# Patient Record
Sex: Female | Born: 1965 | Hispanic: Yes | Marital: Married | Smoking: Never smoker
Health system: Southern US, Community
[De-identification: ages and names within clinical notes are randomized; demographics above are authoritative.]

## PROBLEM LIST (undated history)

## (undated) DIAGNOSIS — M5432 Sciatica, left side: Secondary | ICD-10-CM

## (undated) DIAGNOSIS — R202 Paresthesia of skin: Secondary | ICD-10-CM

## (undated) DIAGNOSIS — E785 Hyperlipidemia, unspecified: Secondary | ICD-10-CM

## (undated) DIAGNOSIS — F419 Anxiety disorder, unspecified: Secondary | ICD-10-CM

## (undated) DIAGNOSIS — K635 Polyp of colon: Secondary | ICD-10-CM

## (undated) DIAGNOSIS — D509 Iron deficiency anemia, unspecified: Secondary | ICD-10-CM

## (undated) DIAGNOSIS — F418 Other specified anxiety disorders: Secondary | ICD-10-CM

## (undated) DIAGNOSIS — G5603 Carpal tunnel syndrome, bilateral upper limbs: Secondary | ICD-10-CM

## (undated) DIAGNOSIS — E669 Obesity, unspecified: Secondary | ICD-10-CM

## (undated) DIAGNOSIS — R7303 Prediabetes: Secondary | ICD-10-CM

## (undated) DIAGNOSIS — Z862 Personal history of diseases of the blood and blood-forming organs and certain disorders involving the immune mechanism: Secondary | ICD-10-CM

## (undated) DIAGNOSIS — I10 Essential (primary) hypertension: Secondary | ICD-10-CM

## (undated) HISTORY — DX: Other specified anxiety disorders: F41.8

## (undated) HISTORY — DX: Prediabetes: R73.03

## (undated) HISTORY — DX: Paresthesia of skin: R20.2

## (undated) HISTORY — DX: Sciatica, left side: M54.32

## (undated) HISTORY — DX: Carpal tunnel syndrome, bilateral upper limbs: G56.03

## (undated) HISTORY — DX: Obesity, unspecified: E66.9

## (undated) HISTORY — DX: Anxiety disorder, unspecified: F41.9

## (undated) HISTORY — DX: Personal history of diseases of the blood and blood-forming organs and certain disorders involving the immune mechanism: Z86.2

## (undated) HISTORY — DX: Polyp of colon: K63.5

## (undated) HISTORY — DX: Iron deficiency anemia, unspecified: D50.9

## (undated) HISTORY — DX: Hyperlipidemia, unspecified: E78.5

---

## 2004-03-10 HISTORY — PX: ENDOMETRIAL ABLATION: SHX621

## 2004-06-22 HISTORY — PX: COLONOSCOPY: SHX174

## 2006-06-18 ENCOUNTER — Ambulatory Visit (HOSPITAL_COMMUNITY): Admission: RE | Admit: 2006-06-18 | Discharge: 2006-06-18 | Payer: Self-pay | Admitting: Family Medicine

## 2007-07-25 ENCOUNTER — Emergency Department (HOSPITAL_COMMUNITY): Admission: EM | Admit: 2007-07-25 | Discharge: 2007-07-25 | Payer: Self-pay | Admitting: Emergency Medicine

## 2007-09-29 ENCOUNTER — Ambulatory Visit (HOSPITAL_COMMUNITY): Admission: RE | Admit: 2007-09-29 | Discharge: 2007-09-29 | Payer: Self-pay | Admitting: Family Medicine

## 2009-01-23 ENCOUNTER — Ambulatory Visit (HOSPITAL_COMMUNITY): Admission: RE | Admit: 2009-01-23 | Discharge: 2009-01-23 | Payer: Self-pay | Admitting: Family Medicine

## 2010-01-28 ENCOUNTER — Ambulatory Visit (HOSPITAL_COMMUNITY): Admission: RE | Admit: 2010-01-28 | Discharge: 2010-01-28 | Payer: Self-pay | Admitting: Family Medicine

## 2011-01-21 ENCOUNTER — Other Ambulatory Visit (HOSPITAL_COMMUNITY): Payer: Self-pay | Admitting: Family Medicine

## 2011-01-21 DIAGNOSIS — Z139 Encounter for screening, unspecified: Secondary | ICD-10-CM

## 2011-02-03 ENCOUNTER — Ambulatory Visit (HOSPITAL_COMMUNITY)
Admission: RE | Admit: 2011-02-03 | Discharge: 2011-02-03 | Disposition: A | Discharge status: Home / Self Care | Payer: Self-pay | Source: Ambulatory Visit | Attending: Family Medicine | Admitting: Family Medicine

## 2011-02-03 DIAGNOSIS — Z139 Encounter for screening, unspecified: Secondary | ICD-10-CM

## 2011-11-25 IMAGING — MG MM DIGITAL [PERSON_NAME] SCREEN BILAT
5 series · 5 of 5 positions shown · non-contrast
Comparison: Prior studies.

DG DIOSES SCREENING BILAT
Bilateral CC and MLO view(s) were taken.

DIGITAL SCREENING MAMMOGRAM WITH CAD:

[R CC]
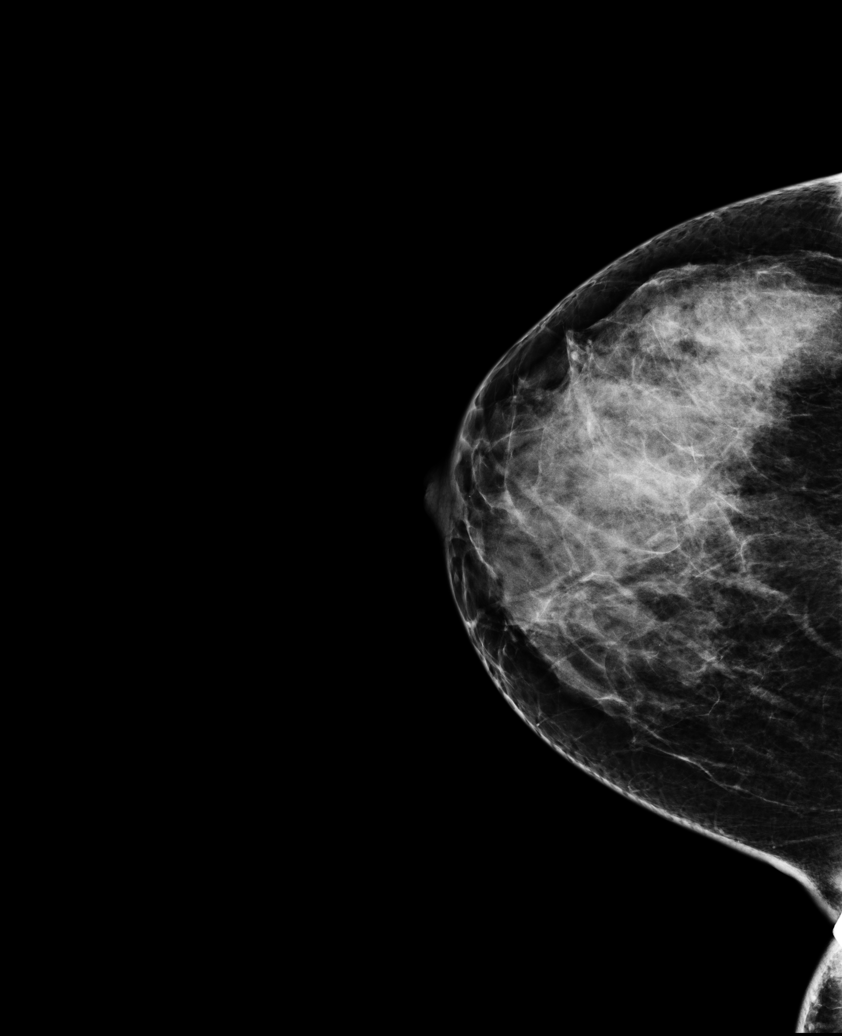

[R MLO (1 of 2)]
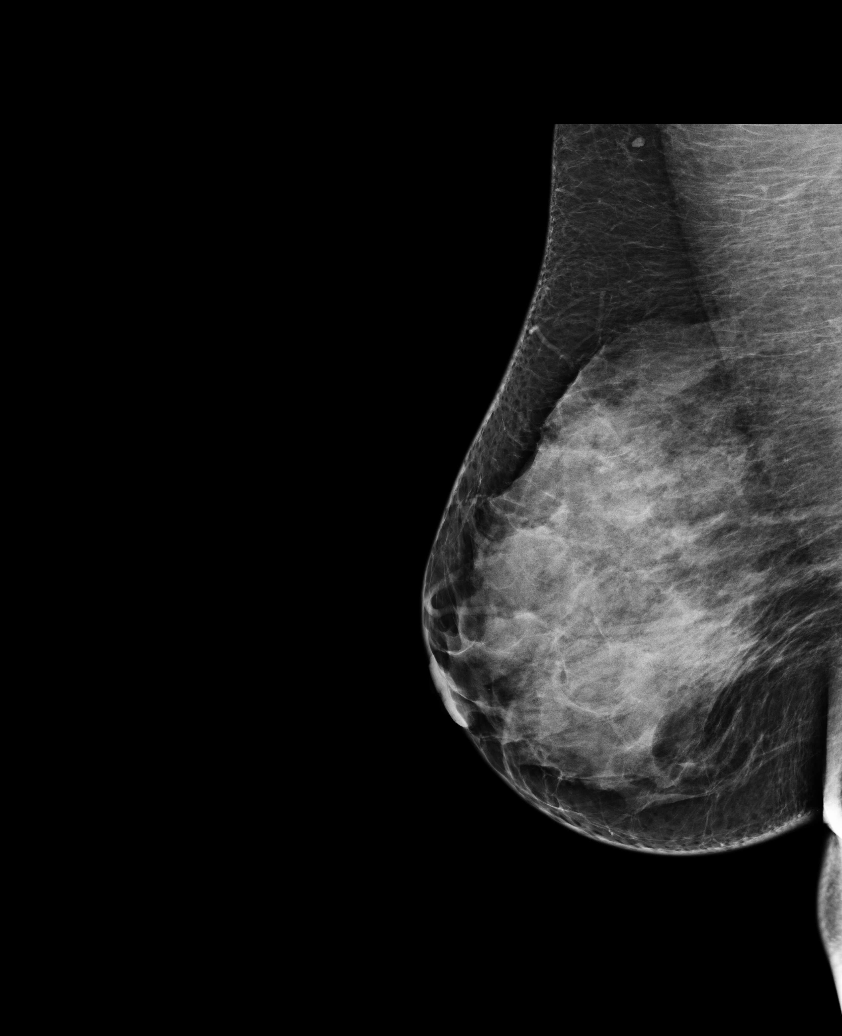

[L CC]
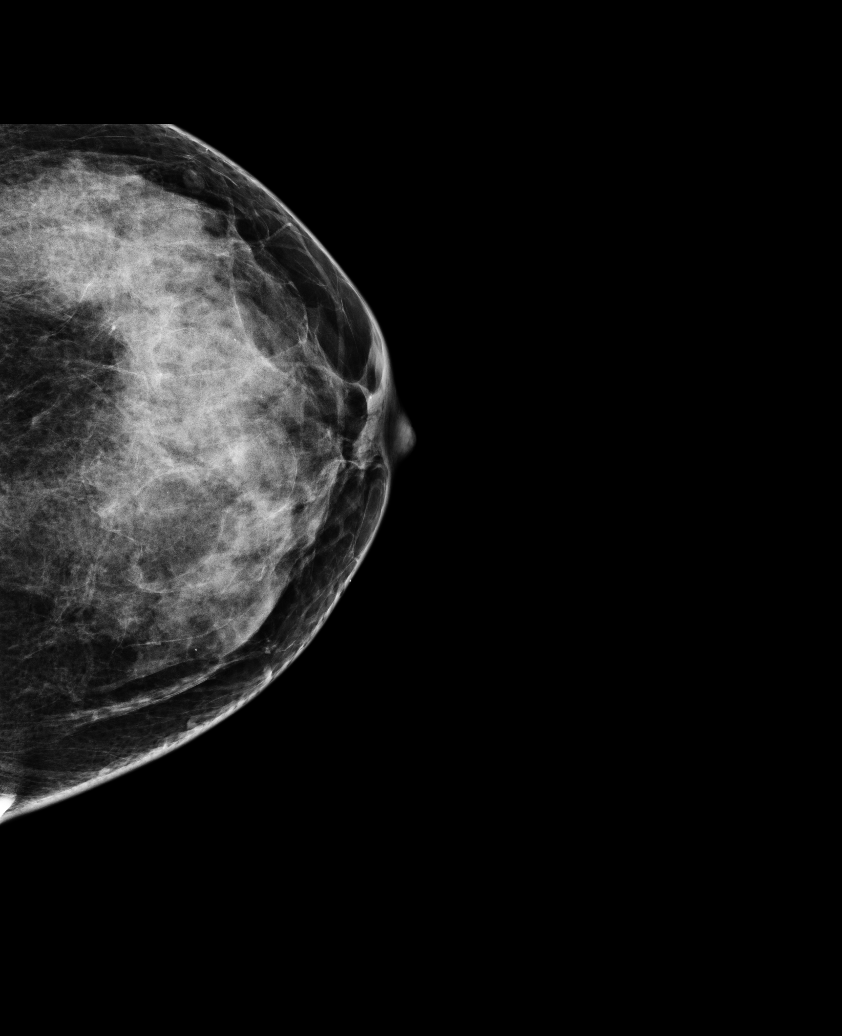

[L MLO]
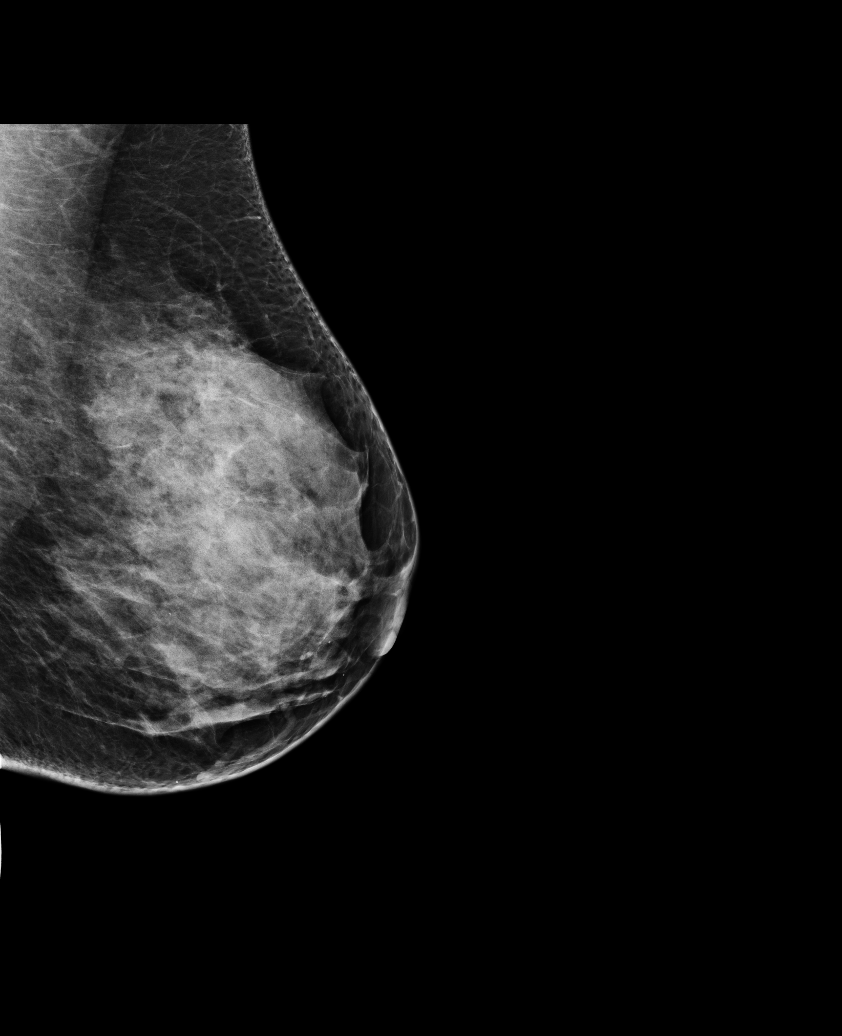

[R MLO (2 of 2)]
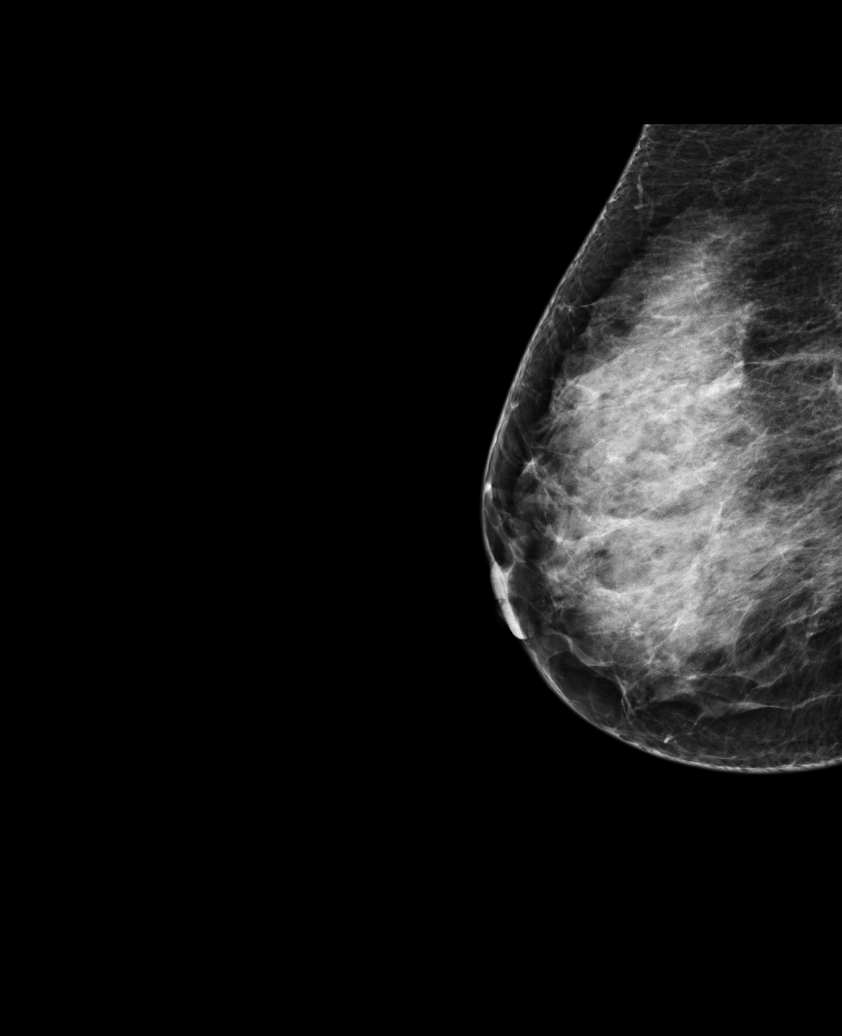

[5 of 5 positions shown; findings below may reference images not displayed]

The breast tissue is extremely dense.  There is no dominant mass, architectural distortion or 
calcification to suggest malignancy.

Images were processed with CAD.
IMPRESSION: No mammographic evidence of malignancy.  Suggest yearly screening mammography.

A result letter of this screening mammogram will be mailed directly to the patient.

ASSESSMENT: Negative - BI-RADS 1

Screening mammogram in 1 year.
,

## 2012-09-06 ENCOUNTER — Other Ambulatory Visit (HOSPITAL_COMMUNITY): Payer: Self-pay | Admitting: Nurse Practitioner

## 2012-09-06 DIAGNOSIS — Z139 Encounter for screening, unspecified: Secondary | ICD-10-CM

## 2012-09-09 ENCOUNTER — Ambulatory Visit (HOSPITAL_COMMUNITY)
Admission: RE | Admit: 2012-09-09 | Discharge: 2012-09-09 | Disposition: A | Discharge status: Home / Self Care | Payer: Self-pay | Source: Ambulatory Visit | Attending: Nurse Practitioner | Admitting: Nurse Practitioner

## 2012-09-09 DIAGNOSIS — Z139 Encounter for screening, unspecified: Secondary | ICD-10-CM

## 2014-07-07 IMAGING — MG MM DIGITAL SCREENING BILAT
4 series · 4 of 4 positions shown · non-contrast
Comparison: Previous exam(s).

CLINICAL DATA: Screening.

DIGITAL SCREENING BILATERAL MAMMOGRAM WITH CAD

[L CC]
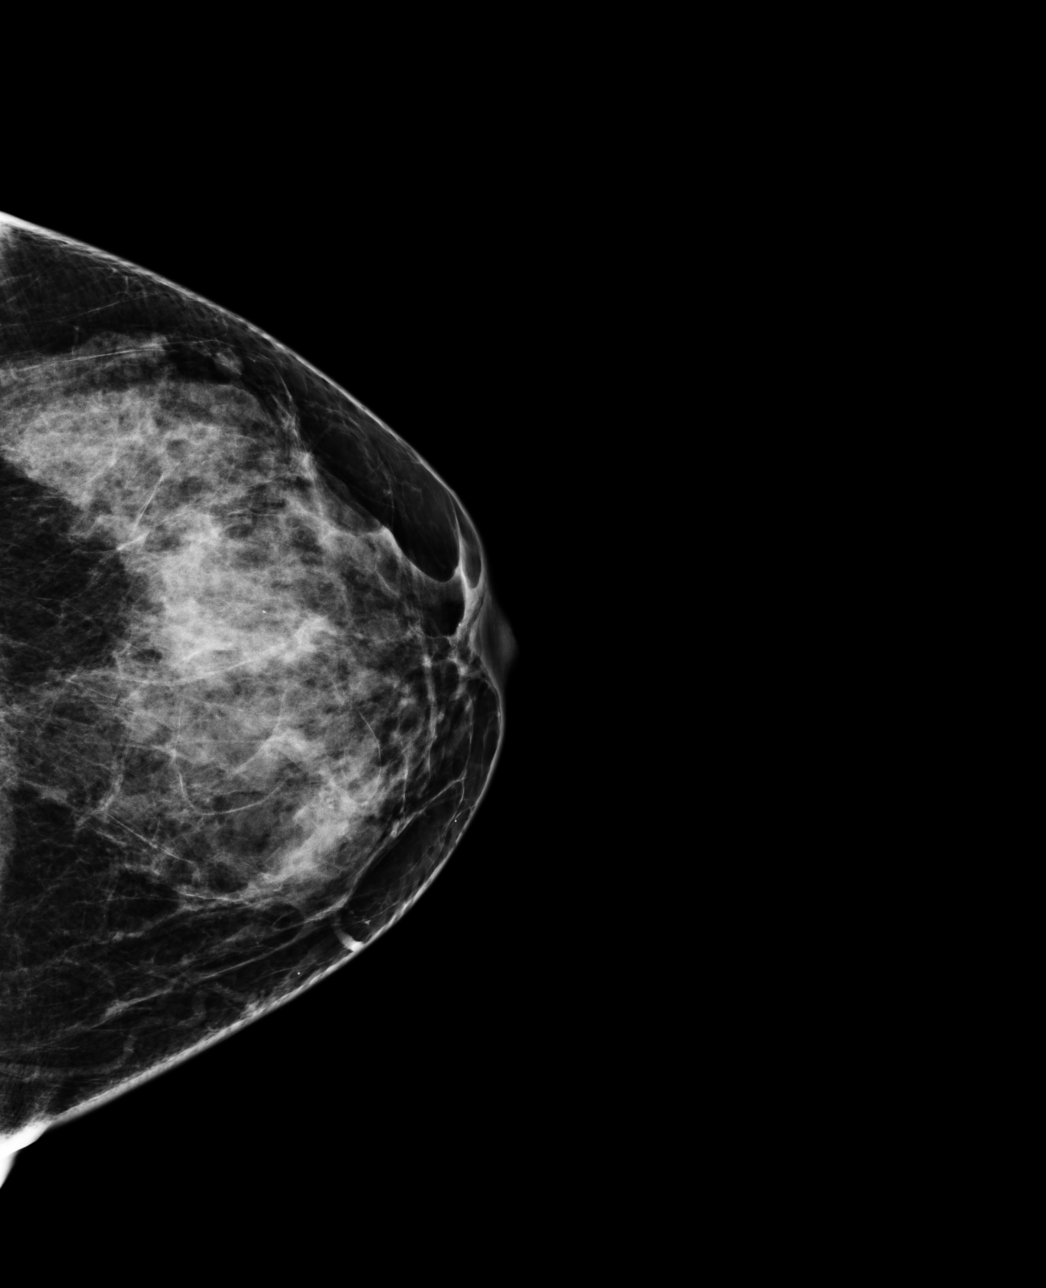

[L MLO]
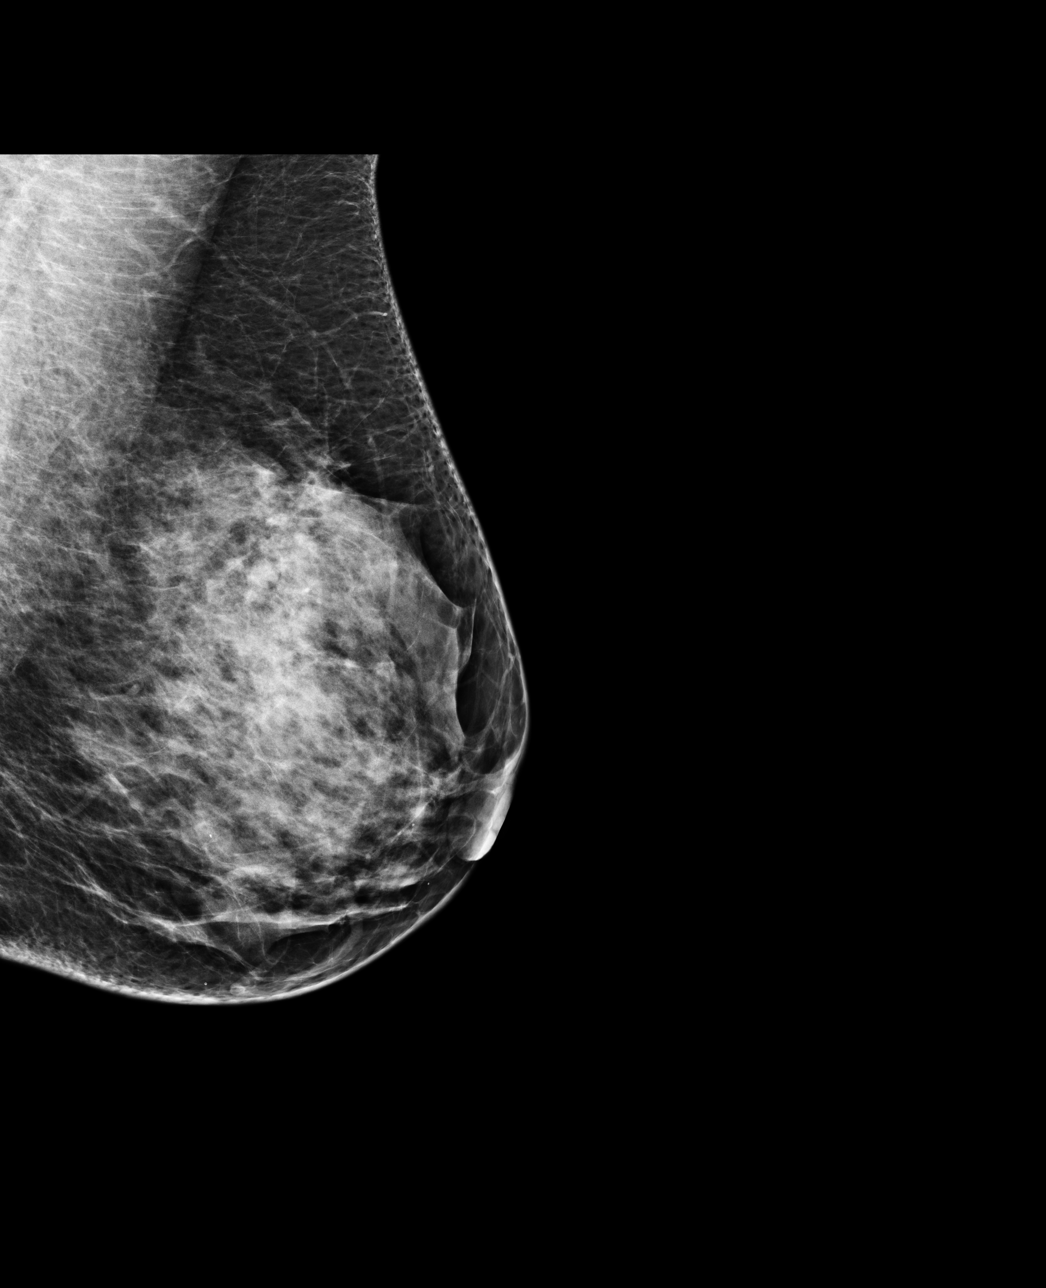

[R CC]
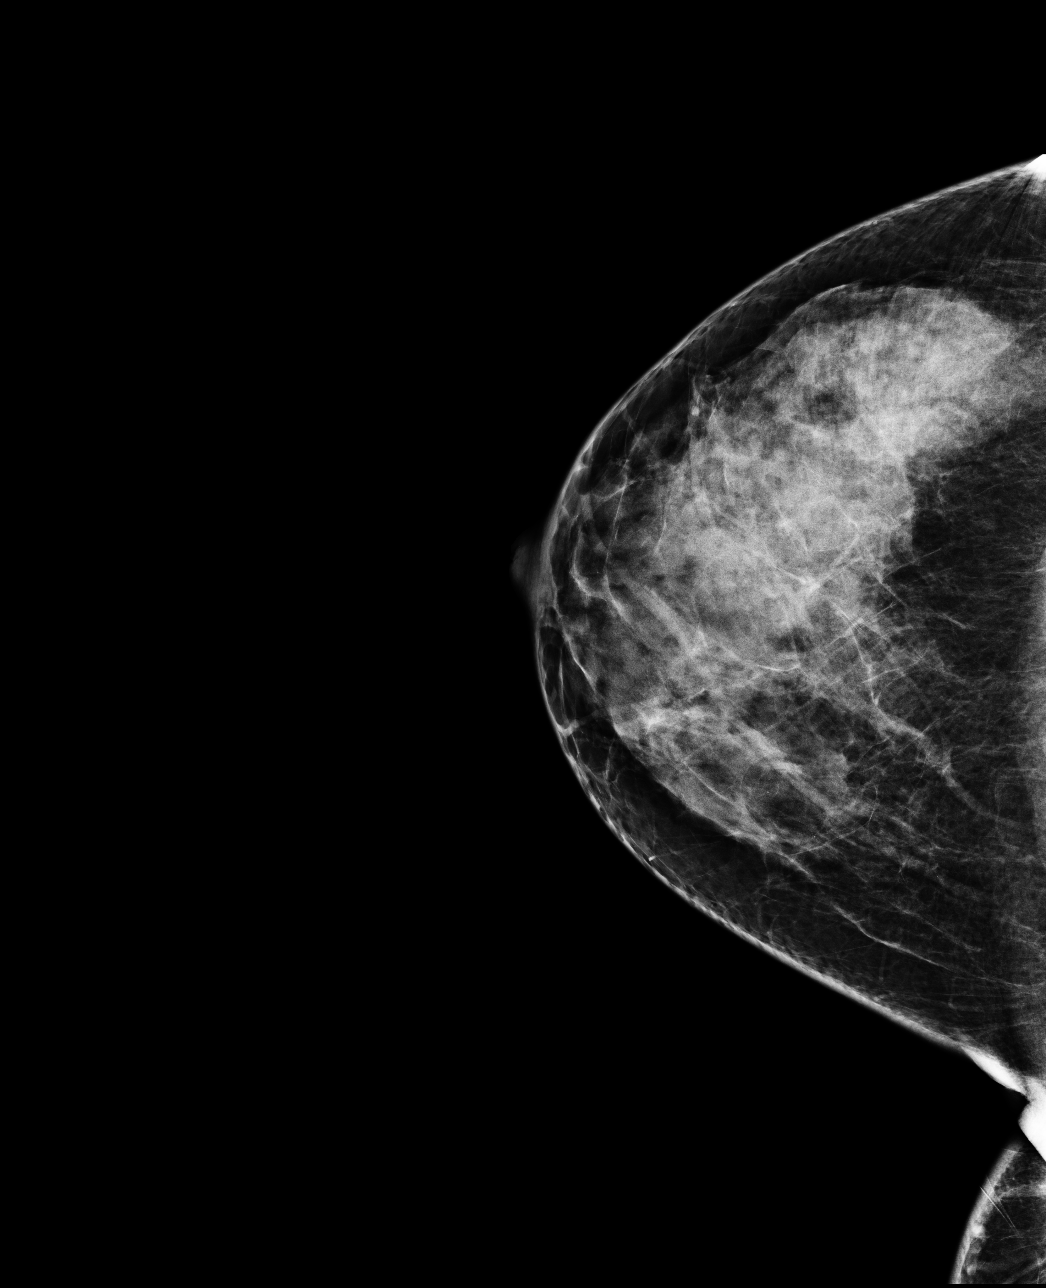

[R MLO]
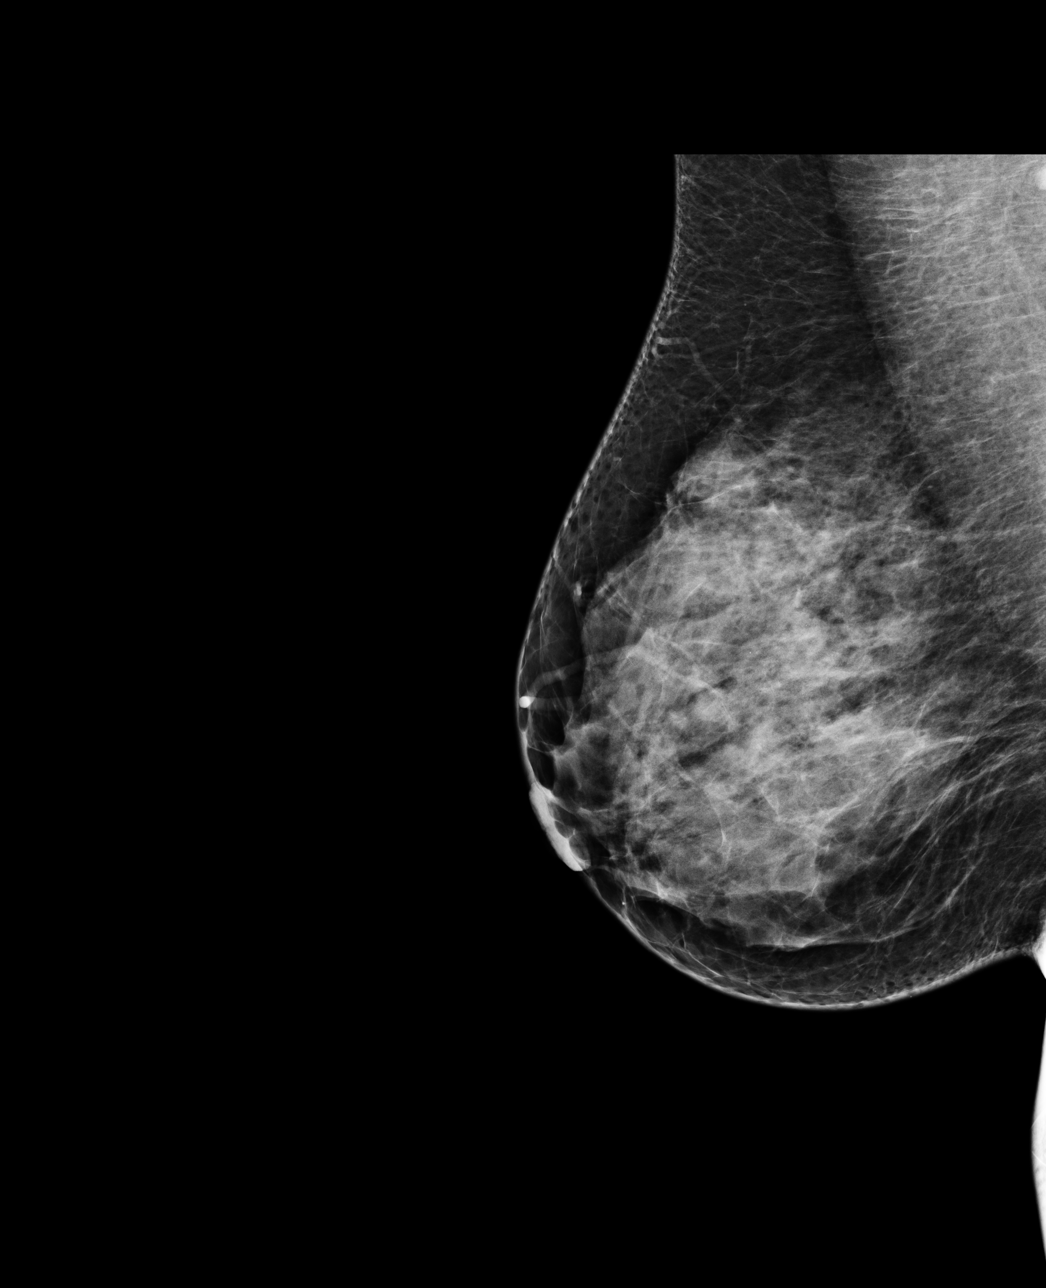

[4 of 4 positions shown; findings below may reference images not displayed]

FINDINGS: ACR Breast Density Category c:  The breasts are heterogeneously
dense, which may obscure small masses.

There are no findings suspicious for malignancy.

Images were processed with CAD.
IMPRESSION: No mammographic evidence of malignancy.

A result letter of this screening mammogram will be mailed directly
to the patient.

RECOMMENDATION:
Screening mammogram in one year. (Code:KZ-2-2F5)

BI-RADS CATEGORY 1:  Negative.

## 2015-06-13 ENCOUNTER — Emergency Department (HOSPITAL_COMMUNITY): Payer: BLUE CROSS/BLUE SHIELD

## 2015-06-13 ENCOUNTER — Emergency Department (HOSPITAL_COMMUNITY)
Admission: EM | Admit: 2015-06-13 | Discharge: 2015-06-13 | Disposition: A | Discharge status: Home / Self Care | Payer: BLUE CROSS/BLUE SHIELD | Attending: Emergency Medicine | Admitting: Emergency Medicine

## 2015-06-13 ENCOUNTER — Encounter (HOSPITAL_COMMUNITY): Payer: Self-pay | Admitting: Emergency Medicine

## 2015-06-13 DIAGNOSIS — Z79899 Other long term (current) drug therapy: Secondary | ICD-10-CM | POA: Diagnosis not present

## 2015-06-13 DIAGNOSIS — R0789 Other chest pain: Secondary | ICD-10-CM | POA: Diagnosis not present

## 2015-06-13 DIAGNOSIS — I10 Essential (primary) hypertension: Secondary | ICD-10-CM | POA: Diagnosis not present

## 2015-06-13 HISTORY — DX: Essential (primary) hypertension: I10

## 2015-06-13 LAB — COMPREHENSIVE METABOLIC PANEL
ALT: 35 U/L (ref 14–54)
AST: 21 U/L (ref 15–41)
Albumin: 4.4 g/dL (ref 3.5–5.0)
Alkaline Phosphatase: 99 U/L (ref 38–126)
Anion gap: 8 (ref 5–15)
BUN: 8 mg/dL (ref 6–20)
CHLORIDE: 104 mmol/L (ref 101–111)
CO2: 25 mmol/L (ref 22–32)
CREATININE: 0.63 mg/dL (ref 0.44–1.00)
Calcium: 9.5 mg/dL (ref 8.9–10.3)
Glucose, Bld: 108 mg/dL — ABNORMAL HIGH (ref 65–99)
POTASSIUM: 4 mmol/L (ref 3.5–5.1)
Sodium: 137 mmol/L (ref 135–145)
TOTAL PROTEIN: 7.8 g/dL (ref 6.5–8.1)
Total Bilirubin: 0.9 mg/dL (ref 0.3–1.2)

## 2015-06-13 LAB — TROPONIN I

## 2015-06-13 LAB — CBC
HCT: 43.7 % (ref 36.0–46.0)
Hemoglobin: 14.8 g/dL (ref 12.0–15.0)
MCH: 31.2 pg (ref 26.0–34.0)
MCHC: 33.9 g/dL (ref 30.0–36.0)
MCV: 92 fL (ref 78.0–100.0)
PLATELETS: 310 10*3/uL (ref 150–400)
RBC: 4.75 MIL/uL (ref 3.87–5.11)
RDW: 13.2 % (ref 11.5–15.5)
WBC: 11.4 10*3/uL — ABNORMAL HIGH (ref 4.0–10.5)

## 2015-06-13 NOTE — ED Notes (Signed)
Onset today woke up with chest burning radiating in jaw and back, with sob

## 2015-06-13 NOTE — ED Provider Notes (Signed)
CSN: [id number]     Arrival date & time 06/13/15  1556 History   First MD Initiated Contact with Patient 06/13/15 1746     Chief Complaint  Patient presents with  . Chest Pain     (Consider location/radiation/quality/duration/timing/severity/associated sxs/prior Treatment) HPI  49 year old female who presents with chest pain. History of hypertension but currently not requiring any medications for this. States that she woke up this morning with sharp burning pain over the left side of her chest to the left shoulder and left side of her neck. Pain has been constant since 2 AM, and seems to be worse when she moves. No shortness of breath, lightheadedness or syncope. States that she has been living heavy boxes of bottles recently at work prior to this. No lower extremity edema or pain. No history of exertional chest pain or dyspnea. Past Medical History  Diagnosis Date  . Hypertension    History reviewed. No pertinent past surgical history. History reviewed. No pertinent family history. Social History  Substance Use Topics  . Smoking status: Never Smoker   . Smokeless tobacco: None  . Alcohol Use: No   OB History    No data available     Review of Systems 10/14 systems reviewed and are negative other than those stated in the HPI    Allergies  Review of patient's allergies indicates no known allergies.  Home Medications   Prior to Admission medications   Medication Sig Start Date End Date Taking? Authorizing Provider  acetaminophen (TYLENOL) 500 MG tablet Take 500 mg by mouth every 8 (eight) hours as needed for mild pain.   Yes Historical Provider, MD  fexofenadine (ALLEGRA ALLERGY) 180 MG tablet Take 180 mg by mouth daily.   Yes Historical Provider, MD  Omega 3 1000 MG CAPS Take 1,000 mg by mouth daily.   Yes Historical Provider, MD   BP 141/81 mmHg  Pulse 68  Temp(Src) 97.9 F (36.6 C) (Oral)  Resp 17  Ht 5' (1.524 m)  Wt 169 lb (76.658 kg)  BMI 33.01 kg/m2  SpO2 98%   LMP 05/13/2015 Physical Exam Physical Exam  Nursing note and vitals reviewed. Constitutional: Well developed, well nourished, non-toxic, and in no acute distress Head: Normocephalic and atraumatic.  Mouth/Throat: Oropharynx is clear and moist.  Neck: Normal range of motion. Neck supple.  Cardiovascular: Normal rate and regular rhythm.   Pulmonary/Chest: Effort normal and breath sounds normal. Pain with movement of torso and rom of the LUE Abdominal: Soft. There is no tenderness. There is no rebound and no guarding.  Musculoskeletal: Normal range of motion.  Neurological: Alert, no facial droop, fluent speech, moves all extremities symmetrically Skin: Skin is warm and dry.  Psychiatric: Cooperative  ED Course  Procedures (including critical care time) Labs Review Labs Reviewed  CBC - Abnormal; Notable for the following:    WBC 11.4 (*)    All other components within normal limits  COMPREHENSIVE METABOLIC PANEL - Abnormal; Notable for the following:    Glucose, Bld 108 (*)    All other components within normal limits  TROPONIN I  TROPONIN I    Imaging Review Dg Chest 2 View  06/13/2015  CLINICAL DATA:  Central chest pain radiating to right upper back and right side of neck beginning this morning EXAM: CHEST  2 VIEW COMPARISON:  07/25/2007 FINDINGS: The heart size and mediastinal contours are within normal limits. Both lungs are clear. The visualized skeletal structures are unremarkable. IMPRESSION: No active cardiopulmonary disease. Electronically  Signed   By: Charlett NoseKevin  Dover M.D.   On: 06/13/2015 16:46   I have personally reviewed and evaluated these images and lab results as part of my medical decision-making.   EKG Interpretation None       EKG ID [id number] 06/13/2015 16:15:13 Normal sinus rhythm, normal axis, normal intervals, no acute ST or T-wave changes to suggest acute ischemia or infarction.  MDM   Final diagnoses:  Chest wall pain  Atypical chest pain     50 year old female who presents with chest pain. She is nontoxic in no acute distress with stable vital signs are presentation. Chest pain seems atypical in nature, and is fully reproducible with range of motion and twisting of her torso. Suggestive of musculoskeletal etiology especially in the setting of heavy lifting of boxes and bottles recently. Her EKG is nonischemic, her chest x-ray shows no acute cardiopulmonary processes, and serial troponins are negative. A repeat EKG also does not show any dynamic changes. Presentation not concerning for that a PE or dissection or other serious or toxic etiology of her pain. Discussed supportive care management for home. Strict return and follow-up instructions are also reviewed. She expressed understanding of all discharge instructions, and felt comfortable to plan of care.     Lavera Guiseana Duo , MD 06/14/15 [phone]

## 2015-06-13 NOTE — ED Notes (Signed)
Pt states understanding of care given and follow up instructions.  Ambulated from ED  

## 2015-06-13 NOTE — Discharge Instructions (Signed)
You do not look like you have any serious cause of your chest pain today. It is likely pain involving the muscles of your chest. Take Tylenol and Motrin as needed for pain control. Return without fail for worsening symptoms including passing out, difficulty breathing, pain with exertion activities, or any other symptoms concerning to you.  Chest Wall Pain Chest wall pain is pain in or around the bones and muscles of your chest. Sometimes, an injury causes this pain. Sometimes, the cause may not be known. This pain may take several weeks or longer to get better. HOME CARE INSTRUCTIONS  Pay attention to any changes in your symptoms. Take these actions to help with your pain:   Rest as told by your health care provider.   Avoid activities that cause pain. These include any activities that use your chest muscles or your abdominal and side muscles to lift heavy items.   If directed, apply ice to the painful area:  Put ice in a plastic bag.  Place a towel between your skin and the bag.  Leave the ice on for 20 minutes, 2-3 times per day.  Take over-the-counter and prescription medicines only as told by your health care provider.  Do not use tobacco products, including cigarettes, chewing tobacco, and e-cigarettes. If you need help quitting, ask your health care provider.  Keep all follow-up visits as told by your health care provider. This is important. SEEK MEDICAL CARE IF:  You have a fever.  Your chest pain becomes worse.  You have new symptoms. SEEK IMMEDIATE MEDICAL CARE IF:  You have nausea or vomiting.  You feel sweaty or light-headed.  You have a cough with phlegm (sputum) or you cough up blood.  You develop shortness of breath.   This information is not intended to replace advice given to you by your health care provider. Make sure you discuss any questions you have with your health care provider.   Document Released: 02/24/2005 Document Revised: 11/15/2014 Document  Reviewed: 05/22/2014 Elsevier Interactive Patient Education 2016 Elsevier Inc.  Nonspecific Chest Pain It is often hard to find the cause of chest pain. There is always a chance that your pain could be related to something serious, such as a heart attack or a blood clot in your lungs. Chest pain can also be caused by conditions that are not life-threatening. If you have chest pain, it is very important to follow up with your doctor.  HOME CARE  If you were prescribed an antibiotic medicine, finish it all even if you start to feel better.  Avoid any activities that cause chest pain.  Do not use any tobacco products, including cigarettes, chewing tobacco, or electronic cigarettes. If you need help quitting, ask your doctor.  Do not drink alcohol.  Take medicines only as told by your doctor.  Keep all follow-up visits as told by your doctor. This is important. This includes any further testing if your chest pain does not go away.  Your doctor may tell you to keep your head raised (elevated) while you sleep.  Make lifestyle changes as told by your doctor. These may include:  Getting regular exercise. Ask your doctor to suggest some activities that are safe for you.  Eating a heart-healthy diet. Your doctor or a diet specialist (dietitian) can help you to learn healthy eating options.  Maintaining a healthy weight.  Managing diabetes, if necessary.  Reducing stress. GET HELP IF:  Your chest pain does not go away, even after treatment.  You have a rash with blisters on your chest.  You have a fever. GET HELP RIGHT AWAY IF:  Your chest pain is worse.  You have an increasing cough, or you cough up blood.  You have severe belly (abdominal) pain.  You feel extremely weak.  You pass out (faint).  You have chills.  You have sudden, unexplained chest discomfort.  You have sudden, unexplained discomfort in your arms, back, neck, or jaw.  You have shortness of breath at  any time.  You suddenly start to sweat, or your skin gets clammy.  You feel nauseous.  You vomit.  You suddenly feel light-headed or dizzy.  Your heart begins to beat quickly, or it feels like it is skipping beats. These symptoms may be an emergency. Do not wait to see if the symptoms will go away. Get medical help right away. Call your local emergency services (911 in the [place] Do not drive yourself to the hospital.   This information is not intended to replace advice given to you by your health care provider. Make sure you discuss any questions you have with your health care provider.   Document Released: 08/13/2007 Document Revised: 03/17/2014 Document Reviewed: 09/30/2013 Elsevier Interactive Patient Education [employer]2016 Elsevier Inc.

## 2015-12-21 ENCOUNTER — Ambulatory Visit (HOSPITAL_COMMUNITY)
Admission: RE | Admit: 2015-12-21 | Discharge: 2015-12-21 | Disposition: A | Discharge status: Home / Self Care | Payer: BLUE CROSS/BLUE SHIELD | Source: Ambulatory Visit | Attending: Physician Assistant | Admitting: Physician Assistant

## 2015-12-21 ENCOUNTER — Other Ambulatory Visit (HOSPITAL_COMMUNITY): Payer: Self-pay | Admitting: Physician Assistant

## 2015-12-21 DIAGNOSIS — Z1231 Encounter for screening mammogram for malignant neoplasm of breast: Secondary | ICD-10-CM

## 2015-12-27 ENCOUNTER — Telehealth: Payer: Self-pay

## 2015-12-27 NOTE — Telephone Encounter (Signed)
Pt is calling to make appointment but I told her that I did not see the referral and she said that it was from June. I told her that I would call the doctor to sent a new referral and we would call her back. I talked with her PCP and they are going to look into it and call us back.

## 2015-12-31 ENCOUNTER — Telehealth: Payer: Self-pay

## 2015-12-31 NOTE — Telephone Encounter (Signed)
Pt had called Thursday last week about referral. DS mailed triage letter out today. Pt is wanting to schedule. Please call [phone]

## 2016-01-01 NOTE — Telephone Encounter (Signed)
I spoke to pt. She had her last colon[place] in 2006 in [place]. She does have family hx of colon cancer. She is also having some problems with hemorrhoids and rectal bleeding. She has been scheduled an OV with Wynne DustEric Gill, NP on 01/15/2016 at 10:30 Am.

## 2016-01-15 ENCOUNTER — Ambulatory Visit (INDEPENDENT_AMBULATORY_CARE_PROVIDER_SITE_OTHER): Payer: BLUE CROSS/BLUE SHIELD | Admitting: Nurse Practitioner

## 2016-01-15 ENCOUNTER — Other Ambulatory Visit: Payer: Self-pay

## 2016-01-15 ENCOUNTER — Encounter: Payer: Self-pay | Admitting: Nurse Practitioner

## 2016-01-15 ENCOUNTER — Telehealth: Payer: Self-pay | Admitting: Internal Medicine

## 2016-01-15 DIAGNOSIS — K922 Gastrointestinal hemorrhage, unspecified: Secondary | ICD-10-CM | POA: Insufficient documentation

## 2016-01-15 DIAGNOSIS — K625 Hemorrhage of anus and rectum: Secondary | ICD-10-CM

## 2016-01-15 DIAGNOSIS — K649 Unspecified hemorrhoids: Secondary | ICD-10-CM

## 2016-01-15 NOTE — Assessment & Plan Note (Signed)
The patient thinks she has hemorrhoids. No overt rectal pain or discomfort. Is having scant toilet tissue hematochezia/rectal bleeding. I will send an Anusol rectal cream to help with her symptoms. Continue to monitor, colonoscopy as noted above. Return for follow-up as needed. May be a candidate for hemorrhoid banding depending on hemorrhoid size and location.

## 2016-01-15 NOTE — Progress Notes (Signed)
cc'ed to pcp °

## 2016-01-15 NOTE — Telephone Encounter (Signed)
Patient was seen today and called this afternoon [facility] to them [facility]. I told her that I would send a message to the nurse and for her to call the pharmacy later this afternoon to check first to see if it was there.

## 2016-01-15 NOTE — Assessment & Plan Note (Signed)
The patient notes rectal bleeding which she thinks are coming from hemorrhoids. However, her last colonoscopy was over 10 years ago and she is 50 years old currently. Last colonoscopy noted what appears to be internal hemorrhoids on retroflexed view. The images were marked for scanning, no report accompanied images. Hemorrhoid management as noted above. At this point we will proceed with diagnostic colonoscopy for rectal bleeding and cancer screening. Return for follow-up based on postprocedure recommendations.  The patient may be a candidate for hemorrhoid banding  Proceed with TCS with 25 mg preprocedure Phenergan with Dr. Jena Gaussourk in near future: the risks, benefits, and alternatives have been discussed with the patient in detail. The patient states understanding and desires to proceed.  **NOTE: Patient is Jehovah's Witness and DOES NOT want any blood products** The patient is currently on Wellbutrin and Prozac. Denies alcohol and drug use. No other anticoagulants, anxiolytics, chronic pain medications, or antidepressants. I will provide 25 mg preprocedure Phenergan to promote adequate sedation.

## 2016-01-15 NOTE — Telephone Encounter (Signed)
Routing to EG 

## 2016-01-15 NOTE — Progress Notes (Signed)
Primary Care Physician:  Selinda FlavinHOWARD, KEVIN, MD Primary Gastroenterologist:  Dr. Jena Gaussourk  Chief Complaint  Patient presents with  . Colonoscopy    had one in 2006  . Hemorrhoids    has bleeding    HPI:   Gloria Nelson is a 50 y.o. female who presents To schedule diagnostic colonoscopy. PCP notes reviewed. The [facility] [place]. PCP notes reviewed which indicates 50 year old female with no family history of colon cancer due for screening colonoscopy. Her last colonoscopy was over 10 years ago. Images provided from previous colonoscopy which shows what appear to be internal hemorrhoids. Patient also complained of hemorrhoid symptoms and rectal bleeding. Phone triage was deferred to an office visit due to family history of colon cancer, hemorrhoid symptoms, rectal bleeding.  Today she is accompanied by a Nurse, learning disabilitytranslator. Today she states she's doing well overall. Last one [place] >10 years ago in [place]. Thinks she's also having hemorrhoid symptoms. Denies hematochezia all last week and yesterday, none today as of yet. Denies rectal pain or irritation. Denies abdominal pain, N/V, melena, fever, chills, acute changes in bowel habits, unintentional weight loss.   **NOTE: Patient is Jehovah's Witness and DOES NOT want any blood products**  Past Medical History:  Diagnosis Date  . Hypertension     Past Surgical History:  Procedure Laterality Date  . COLONOSCOPY  06/22/2004   Images only (marked for scanning), no report.  . ENDOMETRIAL ABLATION  2006    Current Outpatient Prescriptions  Medication Sig Dispense Refill  . acetaminophen (TYLENOL) 500 MG tablet Take 500 mg by mouth every 8 (eight) hours as needed for mild pain.    Marland Kitchen. buPROPion (WELLBUTRIN SR) 150 MG 12 hr tablet Take 150 mg by mouth daily.  4  . fexofenadine (ALLEGRA ALLERGY) 180 MG tablet Take 180 mg by mouth as needed.     Marland Kitchen. FLUoxetine (PROZAC) 20 MG capsule Take  20 mg by mouth daily.  2  . Omega 3 1000 MG CAPS Take 1,000 mg by mouth daily.     No current facility-administered medications for this visit.     Allergies as of 01/15/2016  . (No Known Allergies)    No family history on file.  Social History   Social History  . Marital status: Married    Spouse name: N/A  . Number of children: N/A  . Years of education: N/A   Occupational History  . Not on file.   Social History Main Topics  . Smoking status: Never Smoker  . Smokeless tobacco: Not on file  . Alcohol use No  . Drug use: No  . Sexual activity: Not on file   Other Topics Concern  . Not on file   Social History Narrative  . No narrative on file    Review of Systems: General: Negative for anorexia, weight loss, fever, chills, fatigue, weakness. ENT: Negative for hoarseness, difficulty swallowing. CV: Negative for chest pain, angina, palpitations, peripheral edema.  Respiratory: Negative for dyspnea at rest, cough, sputum, wheezing.  GI: See history of present illness. MS: Negative for joint pain, low back pain.  Derm: Negative for rash or itching.  Endo: Negative for unusual weight change.  Heme: Negative for bruising or bleeding. Allergy: Negative for rash or hives.    Physical Exam: BP 128/88   Pulse 65   Temp 97.9 F (36.6 C) (Oral)   Ht 4\' 11"  (1.499 m)   Wt 164 lb 3.2  oz (74.5 kg)   LMP 08/28/2015 (Approximate)   BMI 33.16 kg/m  General:   Alert and oriented. Pleasant and cooperative. Well-nourished and well-developed.  Eyes:  Without icterus, sclera clear and conjunctiva pink.  Ears:  Normal auditory acuity. Cardiovascular:  S1, S2 present without murmurs appreciated. Extremities without clubbing or edema. Respiratory:  Clear to auscultation bilaterally. No wheezes, rales, or rhonchi. No distress.  Gastrointestinal:  +BS, rounded but soft, non-tender and non-distended. No HSM noted. No guarding or rebound. No masses appreciated.  Rectal:   Deferred  Musculoskalatal:  Symmetrical without gross deformities. Neurologic:  Alert and oriented x4;  grossly normal neurologically. Psych:  Alert and cooperative. Normal mood and affect. Heme/Lymph/Immune: No excessive bruising noted.    01/15/2016 11:23 AM   Disclaimer: This note was dictated with voice recognition software. Similar sounding words can inadvertently be transcribed and may not be corrected upon review.

## 2016-01-15 NOTE — Patient Instructions (Signed)
English:  1. We'll schedule your procedure for you. 2. More recommendations to be made based on the results of your procedure. 3. I sent in Anusol cream that she can put on twice a day, up to 10 days at a time for hemorrhoid symptoms. 4. Return for follow-up based on recommendations made after your procedure. 5. Call with any questions or problems.   Spanish:  1. Programaremos su procedimiento por usted. 2. Se deben hacer ms recomendaciones basadas en los resultados de su procedimiento. 3. Le envi una crema Anusol que puede poner dos veces al da, hasta 10 das a la vez para los sntomas de las hemorroides. 4. Retorno para seguimiento basado en recomendaciones hechas despus de su procedimiento. 5. Llamar con cualquier pregunta o problema.    **NOTE: translated instructions were reviewed by the official translator who agreed to their accuracy**

## 2016-01-16 ENCOUNTER — Telehealth: Payer: Self-pay | Admitting: Nurse Practitioner

## 2016-01-16 DIAGNOSIS — K649 Unspecified hemorrhoids: Secondary | ICD-10-CM

## 2016-01-16 MED ORDER — HYDROCORTISONE 2.5 % RE CREA: 1.0000 "application " | TOPICAL_CREAM | Freq: Two times a day (BID) | RECTAL | 1 refills | Status: DC

## 2016-01-16 NOTE — Addendum Note (Signed)
Addended by: Delane GingerGILL, ERIC A on: 01/16/2016 07:39 AM   Modules accepted: Orders

## 2016-01-16 NOTE — Telephone Encounter (Signed)
Please tell the patient to check again and see if it went through, I put it in again. Thanks!

## 2016-01-16 NOTE — Telephone Encounter (Signed)
Opened in error

## 2016-01-17 ENCOUNTER — Encounter: Payer: Self-pay | Admitting: Nurse Practitioner

## 2016-01-30 ENCOUNTER — Encounter (HOSPITAL_COMMUNITY): Payer: Self-pay | Admitting: *Deleted

## 2016-01-30 ENCOUNTER — Encounter (HOSPITAL_COMMUNITY)
Admission: RE | Disposition: A | Discharge status: Home / Self Care | Payer: Self-pay | Source: Ambulatory Visit | Attending: Internal Medicine

## 2016-01-30 ENCOUNTER — Ambulatory Visit (HOSPITAL_COMMUNITY)
Admission: RE | Admit: 2016-01-30 | Discharge: 2016-01-30 | Disposition: A | Discharge status: Home / Self Care | Payer: BLUE CROSS/BLUE SHIELD | Source: Ambulatory Visit | Attending: Internal Medicine | Admitting: Internal Medicine

## 2016-01-30 DIAGNOSIS — K921 Melena: Secondary | ICD-10-CM | POA: Insufficient documentation

## 2016-01-30 DIAGNOSIS — K621 Rectal polyp: Secondary | ICD-10-CM | POA: Diagnosis not present

## 2016-01-30 DIAGNOSIS — K641 Second degree hemorrhoids: Secondary | ICD-10-CM | POA: Insufficient documentation

## 2016-01-30 DIAGNOSIS — K625 Hemorrhage of anus and rectum: Secondary | ICD-10-CM

## 2016-01-30 DIAGNOSIS — K5730 Diverticulosis of large intestine without perforation or abscess without bleeding: Secondary | ICD-10-CM | POA: Insufficient documentation

## 2016-01-30 DIAGNOSIS — I10 Essential (primary) hypertension: Secondary | ICD-10-CM | POA: Insufficient documentation

## 2016-01-30 HISTORY — PX: COLONOSCOPY: SHX5424

## 2016-01-30 SURGERY — COLONOSCOPY
Anesthesia: Moderate Sedation

## 2016-01-30 MED ORDER — ONDANSETRON HCL 4 MG/2ML IJ SOLN
INTRAMUSCULAR | Status: AC
  Filled 2016-01-30: qty 2

## 2016-01-30 MED ORDER — MIDAZOLAM HCL 5 MG/5ML IJ SOLN
INTRAMUSCULAR | Status: DC | PRN
  Administered 2016-01-30: 2 mg via INTRAVENOUS
  Administered 2016-01-30: 1 mg via INTRAVENOUS

## 2016-01-30 MED ORDER — STERILE WATER FOR IRRIGATION IR SOLN
Status: DC | PRN
  Administered 2016-01-30: 09:00:00

## 2016-01-30 MED ORDER — MEPERIDINE HCL 100 MG/ML IJ SOLN
INTRAMUSCULAR | Status: DC | PRN
  Administered 2016-01-30: 50 mg via INTRAVENOUS

## 2016-01-30 MED ORDER — PROMETHAZINE HCL 25 MG/ML IJ SOLN
INTRAMUSCULAR | Status: AC
  Filled 2016-01-30: qty 1

## 2016-01-30 MED ORDER — SODIUM CHLORIDE 0.9 % IV SOLN
INTRAVENOUS | Status: DC
Start: 2016-01-30 — End: 2016-01-30
  Administered 2016-01-30: 08:00:00 via INTRAVENOUS

## 2016-01-30 MED ORDER — MEPERIDINE HCL 100 MG/ML IJ SOLN
INTRAMUSCULAR | Status: AC
  Filled 2016-01-30: qty 2

## 2016-01-30 MED ORDER — PROMETHAZINE HCL 25 MG/ML IJ SOLN
25.0000 mg | Freq: Once | INTRAMUSCULAR | Status: AC
  Administered 2016-01-30: 25 mg via INTRAVENOUS

## 2016-01-30 MED ORDER — MIDAZOLAM HCL 5 MG/5ML IJ SOLN
INTRAMUSCULAR | Status: AC
  Filled 2016-01-30: qty 10

## 2016-01-30 MED ORDER — SODIUM CHLORIDE 0.9% FLUSH
INTRAVENOUS | Status: AC
  Filled 2016-01-30: qty 10

## 2016-01-30 MED ORDER — ONDANSETRON HCL 4 MG/2ML IJ SOLN
INTRAMUSCULAR | Status: DC | PRN
  Administered 2016-01-30: 4 mg via INTRAVENOUS

## 2016-01-30 NOTE — H&P (View-Only) (Signed)
  Primary Care Physician:  HOWARD, KEVIN, MD Primary Gastroenterologist:  Dr. Rourk  Chief Complaint  Patient presents with  . Colonoscopy    had one in 2006  . Hemorrhoids    has bleeding    HPI:   Gloria Nelson is a 50 y.o. female who presents To schedule diagnostic colonoscopy. PCP notes reviewed. The patient's is [facility] [place] [place]. PCP notes reviewed which indicates 52-year-old female with no family history of colon cancer due for screening colonoscopy. Her last colonoscopy was over 10 years ago. Images provided from previous colonoscopy which shows what appear to be internal hemorrhoids. Patient also complained of hemorrhoid symptoms and rectal bleeding. Phone triage was deferred to an office visit due to family history of colon cancer, hemorrhoid symptoms, rectal bleeding.  Today she is accompanied by a translator. Today she states she's doing well overall. Last one was >10 years ago in [place]. Thinks she's also having hemorrhoid symptoms. Denies hematochezia all last week and yesterday, none today as of yet. Denies rectal pain or irritation. Denies abdominal pain, N/V, melena, fever, chills, acute changes in bowel habits, unintentional weight loss.   **NOTE: Patient is Jehovah's Witness and DOES NOT want any blood products**  Past Medical History:  Diagnosis Date  . Hypertension     Past Surgical History:  Procedure Laterality Date  . COLONOSCOPY  06/22/2004   Images only (marked for scanning), no report.  . ENDOMETRIAL ABLATION  2006    Current Outpatient Prescriptions  Medication Sig Dispense Refill  . acetaminophen (TYLENOL) 500 MG tablet Take 500 mg by mouth every 8 (eight) hours as needed for mild pain.    . buPROPion (WELLBUTRIN SR) 150 MG 12 hr tablet Take 150 mg by mouth daily.  4  . fexofenadine (ALLEGRA ALLERGY) 180 MG tablet Take 180 mg by mouth as needed.     . FLUoxetine (PROZAC) 20 MG capsule Take  20 mg by mouth daily.  2  . Omega 3 1000 MG CAPS Take 1,000 mg by mouth daily.     No current facility-administered medications for this visit.     Allergies as of 01/15/2016  . (No Known Allergies)    No family history on file.  Social History   Social History  . Marital status: Married    Spouse name: N/A  . Number of children: N/A  . Years of education: N/A   Occupational History  . Not on file.   Social History Main Topics  . Smoking status: Never Smoker  . Smokeless tobacco: Not on file  . Alcohol use No  . Drug use: No  . Sexual activity: Not on file   Other Topics Concern  . Not on file   Social History Narrative  . No narrative on file    Review of Systems: General: Negative for anorexia, weight loss, fever, chills, fatigue, weakness. ENT: Negative for hoarseness, difficulty swallowing. CV: Negative for chest pain, angina, palpitations, peripheral edema.  Respiratory: Negative for dyspnea at rest, cough, sputum, wheezing.  GI: See history of present illness. MS: Negative for joint pain, low back pain.  Derm: Negative for rash or itching.  Endo: Negative for unusual weight change.  Heme: Negative for bruising or bleeding. Allergy: Negative for rash or hives.    Physical Exam: BP 128/88   Pulse 65   Temp 97.9 F (36.6 C) (Oral)   Ht 4' 11" (1.499 m)   Wt 164 lb 3.2   oz (74.5 kg)   LMP 08/28/2015 (Approximate)   BMI 33.16 kg/m  General:   Alert and oriented. Pleasant and cooperative. Well-nourished and well-developed.  Eyes:  Without icterus, sclera clear and conjunctiva pink.  Ears:  Normal auditory acuity. Cardiovascular:  S1, S2 present without murmurs appreciated. Extremities without clubbing or edema. Respiratory:  Clear to auscultation bilaterally. No wheezes, rales, or rhonchi. No distress.  Gastrointestinal:  +BS, rounded but soft, non-tender and non-distended. No HSM noted. No guarding or rebound. No masses appreciated.  Rectal:   Deferred  Musculoskalatal:  Symmetrical without gross deformities. Neurologic:  Alert and oriented x4;  grossly normal neurologically. Psych:  Alert and cooperative. Normal mood and affect. Heme/Lymph/Immune: No excessive bruising noted.    01/15/2016 11:23 AM   Disclaimer: This note was dictated with voice recognition software. Similar sounding words can inadvertently be transcribed and may not be corrected upon review.

## 2016-01-30 NOTE — Discharge Instructions (Signed)
Colonoscopy Discharge Instructions  Read the instructions outlined below and refer to this sheet in the next few weeks. These discharge instructions provide you with general information on caring for yourself after you leave the hospital. Your doctor may also give you specific instructions. While your treatment has been planned according to the most current medical practices available, unavoidable complications occasionally occur. If you have any problems or questions after discharge, call Dr. Jena Nelson at [phone]. ACTIVITY  You may resume your regular activity, but move at a slower pace for the next 24 hours.   Take frequent rest periods for the next 24 hours.   Walking will help get rid of the air and reduce the bloated feeling in your belly (abdomen).   No driving for 24 hours (because of the medicine (anesthesia) used during the test).    Do not sign any important legal documents or operate any machinery for 24 hours (because of the anesthesia used during the test).  NUTRITION  Drink plenty of fluids.   You may resume your normal diet as instructed by your doctor.   Begin with a light meal and progress to your normal diet. Heavy or fried foods are harder to digest and may make you feel sick to your stomach (nauseated).   Avoid alcoholic beverages for 24 hours or as instructed.  MEDICATIONS  You may resume your normal medications unless your doctor tells you otherwise.  WHAT YOU CAN EXPECT TODAY  Some feelings of bloating in the abdomen.   Passage of more gas than usual.   Spotting of blood in your stool or on the toilet paper.  IF YOU HAD POLYPS REMOVED DURING THE COLONOSCOPY:  No aspirin products for 7 days or as instructed.   No alcohol for 7 days or as instructed.   Eat a soft diet for the next 24 hours.  FINDING OUT THE RESULTS OF YOUR TEST Not all test results are available during your visit. If your test results are not back during the visit, make an appointment  with your caregiver to find out the results. Do not assume everything is normal if you have not heard from your caregiver or the medical facility. It is important for you to follow up on all of your test results.  SEEK IMMEDIATE MEDICAL ATTENTION IF:  You have more than a spotting of blood in your stool.   Your belly is swollen (abdominal distention).   You are nauseated or vomiting.   You have a temperature over 101.   You have abdominal pain or discomfort that is severe or gets worse throughout the day.     Colon polyp and diverticulosis information provided  Begin Benefiber 1 tablespoon twice daily  Schedule hemorrhoid banding with me in the office in 4 weeks-will need an interpreter       Diverticulosis Diverticulosis is the condition that develops when small pouches (diverticula) form in the wall of your colon. Your colon, or large intestine, is where water is absorbed and stool is formed. The pouches form when the inside layer of your colon pushes through weak spots in the outer layers of your colon. CAUSES  No one knows exactly what causes diverticulosis. RISK FACTORS  Being older than 50. Your risk for this condition increases with age. Diverticulosis is rare in people younger than 40 years. By age 107, almost everyone has it.  Eating a low-fiber diet.  Being frequently constipated.  Being overweight.  Not getting enough exercise.  Smoking.  Taking over-the-counter  pain medicines, like aspirin and ibuprofen. SYMPTOMS  Most people with diverticulosis do not have symptoms. DIAGNOSIS  Because diverticulosis often has no symptoms, health care providers often discover the condition during an exam for other colon problems. In many cases, a health care provider will diagnose diverticulosis while using a flexible scope to examine the colon (colonoscopy). TREATMENT  If you have never developed an infection related to diverticulosis, you may not need treatment. If you  have had an infection before, treatment may include:  Eating more fruits, vegetables, and grains.  Taking a fiber supplement.  Taking a live bacteria supplement (probiotic).  Taking medicine to relax your colon. HOME CARE INSTRUCTIONS   Drink at least 6-8 glasses of water each day to prevent constipation.  Try not to strain when you have a bowel movement.  Keep all follow-up appointments. If you have had an infection before:  Increase the fiber in your diet as directed by your health care provider or dietitian.  Take a dietary fiber supplement if your health care provider approves.  Only take medicines as directed by your health care provider. SEEK MEDICAL CARE IF:   You have abdominal pain.  You have bloating.  You have cramps.  You have not gone to the bathroom in 3 days. SEEK IMMEDIATE MEDICAL CARE IF:   Your pain gets worse.  Yourbloating becomes very bad.  You have a fever or chills, and your symptoms suddenly get worse.  You begin vomiting.  You have bowel movements that are bloody or black. MAKE SURE YOU:  Understand these instructions.  Will watch your condition.  Will get help right away if you are not doing well or get worse. This information is not intended to replace advice given to you by your health care provider. Make sure you discuss any questions you have with your health care provider. Document Released: 11/22/2003 Document Revised: 03/01/2013 Document Reviewed: 01/19/2013 Elsevier Interactive Patient Education  2017 Elsevier Inc.  Colon Polyps Introduction Polyps are tissue growths inside the body. Polyps can grow in many places, including the large intestine (colon). A polyp may be a round bump or a mushroom-shaped growth. You could have one polyp or several. Most colon polyps are noncancerous (benign). However, some colon polyps can become cancerous over time. What are the causes? The exact cause of colon polyps is not known. What  increases the risk? This condition is more likely to develop in people who:  Have a family history of colon cancer or colon polyps.  Are older than 36 or older than 45 if they are African American.  Have inflammatory bowel disease, such as ulcerative colitis or Crohn disease.  Are overweight.  Smoke cigarettes.  Do not get enough exercise.  Drink too much alcohol.  Eat a diet that is:  High in fat and red meat.  Low in fiber.  Had childhood cancer that was treated with abdominal radiation. What are the signs or symptoms? Most polyps do not cause symptoms. If you have symptoms, they may include:  Blood coming from your rectum when having a bowel movement.  Blood in your stool.The stool may look dark red or black.  A change in bowel habits, such as constipation or diarrhea. How is this diagnosed? This condition is diagnosed with a colonoscopy. This is a procedure that uses a lighted, flexible scope to look at the inside of your colon. How is this treated? Treatment for this condition involves removing any polyps that are found. Those polyps will  then be tested for cancer. If cancer is found, your health care provider will talk to you about options for colon cancer treatment. Follow these instructions at home: Diet  Eat plenty of fiber, such as fruits, vegetables, and whole grains.  Eat foods that are high in calcium and vitamin D, such as milk, cheese, yogurt, eggs, liver, fish, and broccoli.  Limit foods high in fat, red meats, and processed meats, such as hot dogs, sausage, bacon, and lunch meats.  Maintain a healthy weight, or lose weight if recommended by your health care provider. General instructions  Do not smoke cigarettes.  Do not drink alcohol excessively.  Keep all follow-up visits as told by your health care provider. This is important. This includes keeping regularly scheduled colonoscopies. Talk to your health care provider about when you need a  colonoscopy.  Exercise every day or as told by your health care provider. Contact a health care provider if:  You have new or worsening bleeding during a bowel movement.  You have new or increased blood in your stool.  You have a change in bowel habits.  You unexpectedly lose weight. This information is not intended to replace advice given to you by your health care provider. Make sure you discuss any questions you have with your health care provider. Document Released: 11/21/2003 Document Revised: 08/02/2015 Document Reviewed: 01/15/2015     Colonoscopia en los adultos (Colonoscopy, Adult) Burkina Fasona colonoscopia es un examen que se realiza para examinar el intestino grueso. Se hace para comprobar si hay problemas, por ejemplo:  Bultos (tumores).  Crecimientos (plipos).  Hinchazn (inflamacin).  Una hemorragia. ANTES DEL PROCEDIMIENTO Comida y bebida  Siga las indicaciones del mdico respecto de las comidas y las bebidas. Estas indicaciones pueden incluir lo siguiente:  Apache CorporationUnos das antes del procedimiento, siga una dieta con bajo contenido de Olymp[place] Villagefibra.  No coma frutos secos.  No coma semillas.  No coma frutas disecadas.  No coma frutas crudas.  No coma verduras.  Siga una dieta de lquidos transparentes durante 1 a 3das antes del procedimiento. No beba lquidos con colorante rojo o morado. Beba solamente lquidos transparentes, por ejemplo:  Caldos o sopas transparentes.  Caf negro o t.  Jugos transparentes.  Refrescos o bebidas deportivas transparentes.  Gelatina.  Helados de agua.  El da del procedimiento, no coma ni beba nada durante las 2horas previas al procedimiento. Preparado intestinal  Si le recetaron un preparado intestinal por va oral:  Tmelo como se lo haya indicado el mdico. A partir del da previo al procedimiento, tendr que tomar una gran cantidad de lquido. El lquido lo har defecar hasta que la materia fecal sea casi transparente o de  color verdoso claro.  Si la piel o la zona anal se le irritan debido a Technical sales engineerla diarrea, Scientist, product/process developmentpuede hacer lo siguiente:  Limpie la zona con toallitas que contengan productos medicinales, por ejemplo, toallitas hmedas para adultos con aloe y vitaminaE.  Aplquese un producto en la piel que suavice la zona, como vaselina.  Si vomita mientras toma el preparado intestinal, descanse durante un mximo de 60minutos. Luego comience a tomar el preparado nuevamente. Si sigue vomitando y no puede tomar el preparado intestinal sin vomitar, llame al mdico. Instrucciones generales   Consulte al mdico si debe cambiar o suspender sus medicamentos habituales. Esto es importante si toma medicamentos para la diabetes o anticoagulantes.  Haga planes para que una persona lo lleve a su casa desde el hospital o la clnica. PROCEDIMIENTO  Pueden colocarle  una va intravenosa (IV) en una de las venas.  Recibir un medicamento como ayuda para relajarse (sedante).  Para reducir el riesgo de infecciones:  Los mdicos se lavarn las manos.  Le lavarn la zona anal con jabn.  Le pedirn que se recueste de costado con las rodillas flexionadas.  El mdico tendr listo un tubo Byarslargo, delgado y flexible. El tubo tendr Posey Boyeruna cmara y Nettie Elmuna luz en el extremo.  Le colocarn el tubo en el ano.  El tubo se introducir suavemente en el intestino grueso.  Le pondrn aire en el intestino grueso para mantenerlo abierto. Es posible que sienta presin o clicos.  La cmara se usar para Stage managertomar fotografas.  Pueden extraerle Carlynn Heralduna pequea muestra de tejido del cuerpo para estudiarla con un microscopio (biopsia). Si se detecta la presencia de posibles problemas, se enviar el tejido a un laboratorio para ser Fifth Third Bancorpanalizado.  Si se encuentran pequeos crecimientos, el mdico puede extirparlos y analizarlos para Landscape architectdetectar la presencia de cncer.  Lentamente se retirar Mudloggerel tubo que le colocaron en el ano. Este procedimiento puede variar  segn el mdico y el hospital. DESPUS DEL PROCEDIMIENTO  El mdico lo controlar con frecuencia hasta que hayan desaparecido los efectos de los medicamentos administrados.  No conduzca durante 24horas despus del procedimiento.  Es posible que encuentre una pequea cantidad de sangre en la materia fecal.  Puede eliminar gases.  Puede sentir clicos o meteorismo leves en el abdomen.  Depende de usted World Fuel Services Corporationretirar los resultados del procedimiento. Pregntele al mdico o consulte en el departamento que realiza el procedimiento cundo CIT Groupestarn los resultados. Esta informacin no tiene Theme park managercomo fin reemplazar el consejo del mdico. Asegrese de hacerle al mdico cualquier pregunta que tenga. Document Released: 06/11/2010 Document Revised: 03/01/2013 Document Reviewed: 05/08/2015 Elsevier Interactive Patient Education  2017 Elsevier Inc.    Plipos en el colon (Colon Polyps) Los plipos son crecimientos de tejido dentro del cuerpo. Pueden crecer en muchos lugares, incluso en el intestino grueso (colon). Un plipo puede ser un bulto redondo o un crecimiento fungiforme. Una persona podra tener uno o varios plipos. La mayora de los plipos son no cancerosos (benignos). Sin embargo, algunos plipos pueden convertirse en cancerosos con el tiempo. CAUSAS No se conoce la causa exacta de los plipos. FACTORES DE RIESGO Es ms probable que esta afeccin se manifieste en las personas que:  Tienen antecedentes familiares de cncer de colon o de plipos en el colon.  Son Venicemayores de [place]50aos o de [place]45aos si son afroamericanas.  Tienen enfermedad inflamatoria intestinal, como enfermedad de Crohn o colitis ulcerosa.  Tienen sobrepeso.  Fuman cigarrillos.  No realizan suficiente actividad fsica.  Beben alcohol en exceso.  Consumen una dieta de estas caractersticas:  Con alto contenido de grasa y carnes rojas.  Con bajo contenido de [place]fibra.  Tuvieron cncer en la niez que se trat con  radiacin abdominal. SNTOMAS La mayora de los plipos no causan sntomas. Si tiene sntomas, estos pueden incluir los siguientes:  Sangrado rectal al defecar.  Sangre en la materia fecal.Heces de color rojo oscuro o negro.  Un cambio en los hbitos intestinales, como estreimiento o diarrea. DIAGNSTICO Esta afeccin se diagnostica con una colonoscopia. En este procedimiento se [place]a un endoscopio flexible que emite luz para observar el interior del colon. TRATAMIENTO El tratamiento de esta afeccin incluye la extirpacin de los plipos que se encuentran. Estos plipos sern analizados luego para detectar la presencia de cncer. Si se confirma la presencia de cncer, el mdico hablar con  usted sobre las opciones de tratamiento del cncer de colon. INSTRUCCIONES PARA EL CUIDADO EN EL HOGAR Dieta   Coma gran cantidad de fibra, como frutas, verduras y Radiation protection practitioner.  Consuma alimentos con alto contenido de calcio y vitaminaD, [place], [place], [place], [place], [place], pescado y [place].  Limite el consumo de alimentos con alto contenido de grasa, carnes rojas y carnes procesadas, como perros calientes, salchichas, tocino y embutidos.  Mantenga un peso saludable o baje de peso si el mdico se lo recomend. Instrucciones generales   No fume cigarrillos.  No beba alcohol en exceso.  Concurra a todas las visitas de control como se lo haya indicado el mdico. Esto es importante. Esto incluye realizarse colonoscopias programadas con regularidad. Hable con el mdico acerca de cundo debe realizarse una colonoscopia.  Haga actividad fsica todos los das o como se lo haya indicado el mdico. SOLICITE ATENCIN MDICA SI:  Conley Rolls aparecen hemorragias o las hemorragias son ms abundantes al defecar.  Le aparece sangre en las heces o hay ms sangre en las heces.  Tiene un cambio en los hbitos intestinales.  [place] de peso inesperadamente. Esta informacin no tiene Theme park manager el  consejo del mdico. Asegrese de hacerle al mdico cualquier pregunta que tenga. Document Released: 08/26/2011 Document Revised: 06/18/2015 Document Reviewed: 01/15/2015 Elsevier Interactive Patient Education  2017 Elsevier Inc.   Diverticulosis (Diverticulosis) La diverticulosis es una enfermedad que aparece cuando se forman pequeos bolsillos (divertculos) en las paredes del colon. El colon, o intestino grueso, es el lugar donde se absorbe agua y se forman las heces. Los bolsillos se forman cuando la capa interna del colon ejerce presin sobre los puntos dbiles de las capas externas. CAUSAS Nadie sabe con exactitud qu causa la diverticulosis. FACTORES DE RIESGO  Ser mayor de 36aos. El riesgo de desarrollar esta enfermedad aumenta con la edad. La diverticulosis es poco frecuente en las personas menores de [place]. A los 80aos, casi todas las personas tienen la enfermedad.  Comer una dieta con bajo contenido de [place].  Estar estreido con frecuencia.  Tener sobrepeso.  No hacer suficiente ejercicio fsico.  Fumar.  Tomar analgsicos de [address], como aspirina e ibuprofeno. SNTOMAS La mayora de las personas que tienen diverticulosis no presentan sntomas. DIAGNSTICO Dado que la diverticulosis no suele causar sntomas, los mdicos a menudo descubren la enfermedad durante un examen de otros problemas de colon. En muchos casos, el mdico diagnosticar la diverticulosis mientras utiliza un endoscopio flexible para examinar el colon (colonoscopa). TRATAMIENTO Si nunca tuvo una infeccin relacionada con la diverticulosis, es posible que no necesite tratamiento. Si ha tenido una infeccin antes, el tratamiento puede incluir:  Comer ms frutas, verduras y cereales.  Tomar un suplemento de [place].  Tomar un suplemento de bacterias vivas (probitico).  Tomar medicamentos para relajar el colon. INSTRUCCIONES PARA EL CUIDADO EN EL HOGAR  Beba por lo menos entre 6 y 8vasos  de agua por da para Banker.  Trate de no hacer fuerza al mover el intestino.  Cumpla con todas las visitas de control. Si ha tenido una infeccin antes:  Aumente la cantidad de fibra en la dieta, segn las indicaciones del mdico o del nutricionista.  Tome un suplemento dietario con fibras si el mdico lo autoriza.  Tome los medicamentos solamente como se lo haya indicado el mdico. SOLICITE ATENCIN MDICA SI:  Siente dolor abdominal.  Tiene meteorismo.  Tiene clicos.  No ha defecado en 3das. SOLICITE ATENCIN MDICA DE INMEDIATO SI:  El dolor [place].  El meteorismo [employer].  Tiene fiebre o escalofros, y los sntomas empeoran repentinamente.  Comienza a vomitar.  La materia fecal es sanguinolenta o negra. ASEGRESE DE QUE:  Comprende estas instrucciones.  Controlar su afeccin.  Recibir ayuda de inmediato si no mejora o si empeora. Esta informacin no tiene Theme park manager el consejo del mdico. Asegrese de hacerle al mdico cualquier pregunta que tenga. Document Released: 02/07/2008 Document Revised: 03/01/2013 Document Reviewed: 01/19/2013 Elsevier Interactive Patient Education  2017 Elsevier Inc.      Hemorroides (Hemorrhoids) Las hemorroides son venas inflamadas adentro o alrededor del recto o del ano. Hay dos tipos de hemorroides:  Hemorroides internas. Se forman en las venas del interior del recto. Pueden abultarse hacia afuera, irritarse y doler.  Hemorroides externas. Se producen en las venas externas del ano y pueden sentirse como un bulto o zona hinchada, dura y dolorosa cerca del ano. La mayora de las hemorroides no causan problemas graves y se Sports coach con tratamientos caseros [employer] cambios en la dieta y el estilo de vida. Si los tratamientos caseros no ayudan con los sntomas, se pueden Education officer, environmental procedimientos para reducir o extirpar las hemorroides. CAUSAS La causa de esta afeccin es el aumento  de la presin en la zona anal. Esta presin puede ser causada por distintos factores, por ejemplo:  Estreimiento.  Dificultad para defecar.  Diarrea.  Embarazo.  Obesidad.  Estar sentado durante largos perodos.  Levantar objetos pesados u otras actividades que impliquen esfuerzo.  Sexo anal. SNTOMAS Los sntomas de esta afeccin incluyen lo siguiente:  Dolor.  Picazn o irritacin anal.  Sangrado rectal.  Prdida de materia fecal (heces).  Inflamacin anal.  Uno o ms bultos alrededor del ano. DIAGNSTICO Esta afeccin se diagnostica frecuentemente a travs de un examen visual. Posiblemente le realicen otros tipos de pruebas o estudios, como los siguientes:  Examen de la zona rectal con una mano enguantada (examen digital rectal).  Examen del canal anal utilizando un pequeo tubo (anoscopio).  Anlisis de sangre si ha perdido [place] cantidad significativa de [place].  Un estudio para observar el interior del colon (sigmoidoscopia o colonoscopia). TRATAMIENTO Esta afeccin generalmente se puede tratar en el hogar. Se pueden realizar diversos procedimientos si los cambios en la dieta, en el estilo de vida y otros tratamientos caseros no [employer]. Estos procedimientos pueden ayudar a reducir o extirpar las hemorroides completamente. Algunos de estos procedimientos son quirrgicos y otros no. Algunos de los procedimientos ms frecuentes son los siguientes:  Ligadura con Curator. Las bandas elsticas se colocan en la base de las hemorroides para interrumpir la irrigacin de [place].  Escleroterapia. Se inyecta un medicamento en las hemorroides para reducir su tamao.  Coagulacin con luz infrarroja. Se utiliza un tipo de energa lumnica para eliminar las hemorroides.  Hemorroidectoma. Las hemorroides se extirpan con [place] y las venas que las [place] se Web designer.  Hemorroidopexia con grapas. Se [place] un dispositivo tipo grapa de forma circular para  extirpar las hemorroides y unas grapas para cortar la sangre que se irriga hacia las hemorroides. INSTRUCCIONES PARA EL CUIDADO EN EL HOGAR Comida y bebida   Consuma alimentos con alto contenido de [place], como cereales integrales, porotos, frutos secos, frutas y verduras. Pregntele a su mdico acerca de tomar productos con fibra aadida en ellos (complementos de fibra).  Beba suficiente lquido para Photographer orina clara o de color amarillo plido. Control del dolor y la hinchazn   [place] baos de  asiento tibios durante 20 minutos, 3 o 4 veces por da para Primary school teacher y las [place].  Si se lo indican, aplique hielo en la zona afectada. Usar compresas de [employer] baos de asiento puede ser [place].  Ponga el hielo en una bolsa plstica.  Coloque una toalla entre la piel y la bolsa de hielo.  Coloque el hielo durante 20 minutos, 2 a 3 veces por da. Instrucciones generales   [employer] de venta libre y los recetados solamente como se lo haya indicado el mdico.  Aplquese los medicamentos, cremas o supositorios como se lo hayan indicado.  Haga ejercicios regularmente.  Vaya al bao cuando sienta la necesidad de defecar. No espere.  Evite hacer fuerza al defecar.  Mantenga la zona anal limpia y seca. Use papel higinico hmedo o toallitas humedecidas despus de defecar.  No pase mucho tiempo sentado en el inodoro. Esto aumenta la afluencia de sangre y Chief Technology Officer. SOLICITE ATENCIN MDICA SI:  Aumenta el dolor y la hinchazn, y no puede controlarlos con los medicamentos o con Pharmacist, community.  Tiene una hemorragia que no Magazine features editor.  No puede defecar o lo hace con dificultad.  Siente dolor o tiene inflamacin fuera de la zona de las hemorroides. Esta informacin no tiene Theme park manager el consejo del mdico. Asegrese de hacerle al mdico cualquier pregunta que tenga. Document Released: 02/24/2005 Document Revised: 06/18/2015 Document Reviewed:  11/08/2014 Elsevier Interactive Patient Education  2017 [employer].

## 2016-01-30 NOTE — Interval H&P Note (Signed)
History and Physical Interval Note:  01/30/2016 8:32 AM  Gloria Nelson  has presented today for surgery, with the diagnosis of RECTAL BLEEDING  The various methods of treatment have been discussed with the patient and family. After consideration of risks, benefits and other options for treatment, the patient has consented to  Procedure(s) with comments: COLONOSCOPY (N/A) - 830 as a surgical intervention .  The patient's history has been reviewed, patient examined, no change in status, stable for surgery.  I have reviewed the patient's chart and labs.  Questions were answered to the patient's satisfaction.    No change; dx TCS per plan.  The risks, benefits, limitations, alternatives and imponderables have been reviewed with the patient. Questions have been answered. All parties are agreeable.  Eula Listenobert 

## 2016-01-30 NOTE — Op Note (Signed)
[facility] Patient Name: Gloria Nelson Procedure Date: 01/30/2016 8:31 AM MRN: [id number] Date of Birth: 1965/09/14 Attending MD: Gennette Pac , MD CSN: [id number] Age: 50 Admit Type: Outpatient Procedure:                Colonoscopy with snare with snare polypectomy Indications:              Hematochezia Providers:                Gennette Pac, MD, Jannett Celestine, RN, Casimer Leek, Technician Referring MD:              Medicines:                Midazolam 3 mg IV, Meperidine 40 mg IV Complications:            No immediate complications. Estimated Blood Loss:     Estimated blood loss: none. Procedure:                Pre-Anesthesia Assessment:                           - Prior to the procedure, a History and Physical                            was performed, and patient medications and                            allergies were reviewed. The patient's tolerance of                            previous anesthesia was also reviewed. The risks                            and benefits of the procedure and the sedation                            options and risks were discussed with the patient.                            All questions were answered, and informed consent                            was obtained. Prior Anticoagulants: The patient has                            taken no previous anticoagulant or antiplatelet                            agents. ASA Grade Assessment: II - A patient with                            mild systemic disease. After reviewing the risks  and benefits, the patient was deemed in                            satisfactory condition to undergo the procedure.                           After obtaining informed consent, the colonoscope                            was passed under direct vision. Throughout the                            procedure, the patient's blood pressure, pulse, and                  oxygen saturations were monitored continuously. The                            [id number]-[id number]) scope was introduced through                            the anus and advanced to the the cecum, identified                            by appendiceal orifice and ileocecal valve. The                            colonoscopy was performed without difficulty. The                            patient tolerated the procedure well. The quality                            of the bowel preparation was adequate. The                            ileocecal valve, appendiceal orifice, and rectum                            were photographed. The entire colon was well                            visualized. Scope In: 8:39:38 AM Scope Out: 8:54:15 AM Scope Withdrawal Time: 0 hours 10 minutes 40 seconds  Total Procedure Duration: 0 hours 14 minutes 37 seconds  Findings:      The perianal and digital rectal examinations were normal.      Non-bleeding internal hemorrhoids were found during retroflexion. The       hemorrhoids were moderate, large and Grade II (internal hemorrhoids that       prolapse but reduce spontaneously).      Multiple diverticula were found in the colon. (1) 5 mm mid rectum was       removed with a cold snare. Resection and retrieval were complete.       Estimated blood loss was minimal. Impression:               -  Non-bleeding internal hemorrhoids.                           - Diverticulosis. Rectal polyp removed. hemorrhoids                            likely source of hematochezia. Moderate Sedation:      Moderate (conscious) sedation was administered by the endoscopy nurse       and supervised by the endoscopist. The following parameters were       monitored: oxygen saturation, heart rate, blood pressure, respiratory       rate, EKG, adequacy of pulmonary ventilation, and response to care.       Total physician intraservice time was 19 minutes. Recommendation:           -  Patient has a contact number available for                            emergencies. The signs and symptoms of potential                            delayed complications were discussed with the                            patient. Return to normal activities tomorrow.                            Written discharge instructions were provided to the                            patient.                           - Resume previous diet.                           - Continue present medications. add Benefiber 1                            tablespoon twice daily to regimen                           - Repeat colonoscopy date to be determined after                            pending pathology results are reviewed for                            surveillance based on pathology results.                           - Return to GI office in 1 month for hemorrhoid                            banding. Procedure Code(s):        --- Professional ---  [phone], Colonoscopy, flexible; with removal of                            tumor(s), polyp(s), or other lesion(s) by snare                            technique                           99152, Moderate sedation services provided by the                            same physician or other qualified health care                            professional performing the diagnostic or                            therapeutic service that the sedation supports,                            requiring the presence of an independent trained                            observer to assist in the monitoring of the                            patient's level of consciousness and physiological                            status; initial 15 minutes of intraservice time,                            patient age 51 years or older Diagnosis Code(s):        --- Professional ---                           K64.1, Second degree hemorrhoids                           K92.1, Melena  (includes Hematochezia)                           K57.30, Diverticulosis of large intestine without                            perforation or abscess without bleeding CPT copyright 2016 American Medical Association. All rights reserved. The codes documented in this report are preliminary and upon coder review may  be revised to meet current compliance requirements. Gerrit Friendsobert M. , MD Gennette Pacobert Michael , MD 01/30/2016 9:06:33 AM This report has been signed electronically. Number of Addenda: 0

## 2016-02-02 ENCOUNTER — Encounter: Payer: Self-pay | Admitting: Internal Medicine

## 2016-02-04 ENCOUNTER — Telehealth: Payer: Self-pay

## 2016-02-04 NOTE — Telephone Encounter (Signed)
PATIENT SCHEDULED AND FLAGGED FOR INTERPRETER

## 2016-02-04 NOTE — Telephone Encounter (Signed)
Letter mailed to the pt. 

## 2016-02-04 NOTE — Telephone Encounter (Signed)
Per RMR-  Corbin Adeobert M Rourk, MD  Myra RudeJulie H , LPN; Ferne ReusSusan D Sherman        Send letter to patient.  Send copy of letter with path to referring provider and PCP. Should return for banding in about a month - will need interpreter

## 2016-02-05 ENCOUNTER — Encounter (HOSPITAL_COMMUNITY): Payer: Self-pay | Admitting: Internal Medicine

## 2016-02-19 ENCOUNTER — Encounter: Payer: Self-pay | Admitting: Internal Medicine

## 2016-02-19 ENCOUNTER — Ambulatory Visit (INDEPENDENT_AMBULATORY_CARE_PROVIDER_SITE_OTHER): Payer: BLUE CROSS/BLUE SHIELD | Admitting: Internal Medicine

## 2016-02-19 VITALS — BP 140/84 | HR 63 | Temp 97.8°F | Ht 59.0 in | Wt 166.2 lb

## 2016-02-19 DIAGNOSIS — K648 Other hemorrhoids: Secondary | ICD-10-CM

## 2016-02-19 NOTE — Progress Notes (Signed)
  [facility] banding procedure note:  The patient presents with symptomatic grade 2 hemorrhoids, unresponsive to maximal medical therapy, requesting rubber band ligation of her hemorrhoidal disease. All risks, benefits, and alternative forms of therapy were described and informed consent was obtained. Communication via [employer].  In the left lateral decubitus position, anoscopy performed after DR E utilizing nitroglycerin ointment and 2% Xylocaine as lubricant. Anoscopy revealed a prominent right lateral hemorrhoid column.   The decision was made to band the right lateral internal hemorrhoid the [facility] O'Regan System was used to perform band ligation without complication. Digital anorectal examination was then performed to assure proper positioning of the band and to adjust the banded tissue as required. Band found to be in excellent position. No pinching or pain. The patient was discharged home without pain or other issues. Dietary and behavioral recommendations were given No complications were encountered and the patient tolerated the procedure well.  Follow-up appointment in 4 weeks

## 2016-02-19 NOTE — Patient Instructions (Signed)
Avoid straining.  Benefiber 1 tablespoon twice daily  Limit toilet time to 5 minutes  Call with any interim problems  Schedule followup appointment in 4 weeks from now   

## 2016-03-25 ENCOUNTER — Encounter: Payer: Self-pay | Admitting: Internal Medicine

## 2016-03-25 ENCOUNTER — Ambulatory Visit (INDEPENDENT_AMBULATORY_CARE_PROVIDER_SITE_OTHER): Payer: BLUE CROSS/BLUE SHIELD | Admitting: Internal Medicine

## 2016-03-25 VITALS — BP 133/84 | HR 87 | Temp 98.0°F | Ht 59.0 in | Wt 167.6 lb

## 2016-03-25 DIAGNOSIS — K648 Other hemorrhoids: Secondary | ICD-10-CM

## 2016-03-25 NOTE — Patient Instructions (Signed)
Avoid straining.  Benefiber 1 tablespoon twice daily  Limit toilet time to 5 minutes  Call with any interim problems  Schedule followup appointment in 4 weeks from now   

## 2016-03-25 NOTE — Progress Notes (Signed)
[facility] banding procedure note:  The patient presents with symptomatic grade 2/3 hemorrhoids unresponsive to maximal medical therapy requesting rubber band ligation of her hemorrhoidal disease. Patient underwent banding of the  right lateral hemorrhoid column initially. She states since banding, bleeding has completely ceased. All risks, benefits and alternative forms of therapy were described and informed consent was obtained.  In the left lateral decubitus position, a DRE revealed no abnormalities.  The decision was made to band the left lateral internal hemorrhoid and the [facility] System was used to perform band ligation without complication. Digital anorectal examination was then performed to assure proper positioning of the band and to adjust the banded tissue as required. Band found to be in excellent position. No pinching or pain. I elected to place a second band on the right anterior hemorrhoid column. The band was found to be in excellent position on follow-up DRE. No pinching or pain. The patient was discharged home without pain or other issues. Dietary and behavioral recommendations were given  The patient will return in 4 weeks for followup and possible additional banding as required.  No complications were encountered and the patient tolerated the procedure well.

## 2016-05-06 ENCOUNTER — Encounter: Payer: Self-pay | Admitting: Internal Medicine

## 2016-05-06 ENCOUNTER — Ambulatory Visit (INDEPENDENT_AMBULATORY_CARE_PROVIDER_SITE_OTHER): Payer: BLUE CROSS/BLUE SHIELD | Admitting: Internal Medicine

## 2016-05-06 VITALS — BP 148/88 | HR 82 | Temp 98.1°F | Ht 59.0 in | Wt 168.0 lb

## 2016-05-06 DIAGNOSIS — K648 Other hemorrhoids: Secondary | ICD-10-CM

## 2016-05-06 NOTE — Progress Notes (Signed)
[facility] banding procedure note:  The patient presents with symptomatic grade 2 hemorrhoids;  Status post banding of the left lateral and right lateral hemorrhoid columns.    Small amount of bleeding following second band placement but, overall, she has done very well. She desires third band placed  All risks, benefits, and alternative forms of therapy were described and informed consent was obtained.  In the left lateral decubitus position, a DRE was performed, DRE performed using 2% Xylocaine gel and 0.125% nitroglycerin as lubricant. No abnormalities detected.  The decision was made to band the right posterior internal hemorrhoid;  the [facility] System was used to perform band ligation without complication. Digital anorectal examination was then performed to assure proper positioning of the band.  Band found to be in excellent position. No pinching or pain.  The patient was discharged home without pain or other issues.   Dietary and behavioral recommendations were given along with follow-up instructions.   The patient will return in 6 months for follow-up.  No complications were encountered and the patient tolerated the procedure well.

## 2016-05-06 NOTE — Patient Instructions (Signed)
Avoid straining.  Continue Metamucil daily.  Limit toilet time to 5 minutes  Call with any interim problems  Schedule followup appointment in 6 months.

## 2016-09-11 ENCOUNTER — Encounter: Payer: Self-pay | Admitting: Internal Medicine

## 2016-12-29 ENCOUNTER — Other Ambulatory Visit (HOSPITAL_COMMUNITY): Payer: Self-pay | Admitting: Physician Assistant

## 2016-12-29 DIAGNOSIS — Z1231 Encounter for screening mammogram for malignant neoplasm of breast: Secondary | ICD-10-CM

## 2017-01-05 ENCOUNTER — Ambulatory Visit (HOSPITAL_COMMUNITY)
Admission: RE | Admit: 2017-01-05 | Discharge: 2017-01-05 | Disposition: A | Discharge status: Home / Self Care | Payer: BLUE CROSS/BLUE SHIELD | Source: Ambulatory Visit | Attending: Physician Assistant | Admitting: Physician Assistant

## 2017-01-05 ENCOUNTER — Encounter (HOSPITAL_COMMUNITY): Payer: Self-pay

## 2017-01-05 DIAGNOSIS — Z1231 Encounter for screening mammogram for malignant neoplasm of breast: Secondary | ICD-10-CM

## 2017-02-26 ENCOUNTER — Other Ambulatory Visit (HOSPITAL_COMMUNITY): Payer: Self-pay | Admitting: Physician Assistant

## 2017-02-26 DIAGNOSIS — Z1231 Encounter for screening mammogram for malignant neoplasm of breast: Secondary | ICD-10-CM

## 2017-02-27 ENCOUNTER — Other Ambulatory Visit (HOSPITAL_COMMUNITY): Payer: Self-pay | Admitting: Physician Assistant

## 2017-02-27 ENCOUNTER — Ambulatory Visit (HOSPITAL_COMMUNITY)
Admission: RE | Admit: 2017-02-27 | Discharge: 2017-02-27 | Disposition: A | Discharge status: Home / Self Care | Payer: BLUE CROSS/BLUE SHIELD | Source: Ambulatory Visit | Attending: Physician Assistant | Admitting: Physician Assistant

## 2017-02-27 DIAGNOSIS — E049 Nontoxic goiter, unspecified: Secondary | ICD-10-CM

## 2017-03-04 ENCOUNTER — Ambulatory Visit (HOSPITAL_COMMUNITY): Payer: BLUE CROSS/BLUE SHIELD

## 2019-07-23 ENCOUNTER — Ambulatory Visit: Payer: Self-pay | Attending: Internal Medicine

## 2019-07-23 DIAGNOSIS — Z23 Encounter for immunization: Secondary | ICD-10-CM

## 2019-07-23 NOTE — Progress Notes (Signed)
   Covid-19 Vaccination Clinic  Name:  Gloria Nelson    MRN: [id number] DOB: [birth year]  07/23/2019  [name] was observed post Covid-19 immunization for 15 minutes without incident. She was provided with Vaccine Information Sheet and instruction to access the V-Safe system.   [name] was instructed to call 911 with any severe reactions post vaccine: Marland Kitchen Difficulty breathing  . Swelling of face and throat  . A fast heartbeat  . A bad rash all over body  . Dizziness and weakness   Immunizations Administered    Name Date Dose VIS Date Route   Pfizer COVID-19 Vaccine 07/23/2019  8:18 AM 0.3 mL 05/04/2018 Intramuscular   Manufacturer: [employer], [employer]   Lot: [id number]   NDC: [address]-8

## 2019-08-15 ENCOUNTER — Ambulatory Visit: Payer: Self-pay | Attending: Internal Medicine

## 2019-08-15 DIAGNOSIS — Z23 Encounter for immunization: Secondary | ICD-10-CM

## 2019-08-15 NOTE — Progress Notes (Signed)
   Covid-19 Vaccination Clinic  Name:  Gloria Nelson    MRN: [id number] DOB: 27-Aug-1965  08/15/2019  Gloria Nelson was observed post Covid-19 immunization for 15 minutes without incident. She was provided with Vaccine Information Sheet and instruction to access the V-Safe system.   Gloria Nelson was instructed to call 911 with any severe reactions post vaccine: Marland Kitchen Difficulty breathing  . Swelling of face and throat  . A fast heartbeat  . A bad rash all over body  . Dizziness and weakness   Immunizations Administered    Name Date Dose VIS Date Route   Pfizer COVID-19 Vaccine 08/15/2019  8:18 AM 0.3 mL 05/04/2018 Intramuscular   Manufacturer: [employer], [employer]   Lot: [id number]   NDC: [address]-3

## 2020-07-16 ENCOUNTER — Other Ambulatory Visit (HOSPITAL_COMMUNITY): Payer: Self-pay | Admitting: Family Medicine

## 2020-07-16 DIAGNOSIS — Z1231 Encounter for screening mammogram for malignant neoplasm of breast: Secondary | ICD-10-CM

## 2020-07-25 ENCOUNTER — Other Ambulatory Visit: Payer: Self-pay

## 2020-07-25 ENCOUNTER — Ambulatory Visit (HOSPITAL_COMMUNITY)
Admission: RE | Admit: 2020-07-25 | Discharge: 2020-07-25 | Disposition: A | Discharge status: Home / Self Care | Payer: 59 | Source: Ambulatory Visit | Attending: Family Medicine | Admitting: Family Medicine

## 2020-07-25 DIAGNOSIS — Z1231 Encounter for screening mammogram for malignant neoplasm of breast: Secondary | ICD-10-CM | POA: Insufficient documentation

## 2021-01-14 ENCOUNTER — Encounter: Payer: Self-pay | Admitting: Internal Medicine

## 2021-02-26 ENCOUNTER — Ambulatory Visit: Payer: 59 | Admitting: Internal Medicine

## 2021-07-15 ENCOUNTER — Other Ambulatory Visit (HOSPITAL_COMMUNITY): Payer: Self-pay | Admitting: Family Medicine

## 2021-07-15 DIAGNOSIS — Z1231 Encounter for screening mammogram for malignant neoplasm of breast: Secondary | ICD-10-CM

## 2021-11-08 DIAGNOSIS — E1165 Type 2 diabetes mellitus with hyperglycemia: Secondary | ICD-10-CM | POA: Diagnosis not present

## 2021-11-08 DIAGNOSIS — E782 Mixed hyperlipidemia: Secondary | ICD-10-CM | POA: Diagnosis not present

## 2021-11-16 DIAGNOSIS — M179 Osteoarthritis of knee, unspecified: Secondary | ICD-10-CM | POA: Diagnosis not present

## 2021-11-16 DIAGNOSIS — Z1231 Encounter for screening mammogram for malignant neoplasm of breast: Secondary | ICD-10-CM | POA: Diagnosis not present

## 2021-11-16 DIAGNOSIS — E6609 Other obesity due to excess calories: Secondary | ICD-10-CM | POA: Diagnosis not present

## 2021-11-16 DIAGNOSIS — Z6835 Body mass index (BMI) 35.0-35.9, adult: Secondary | ICD-10-CM | POA: Diagnosis not present

## 2021-11-16 DIAGNOSIS — K644 Residual hemorrhoidal skin tags: Secondary | ICD-10-CM | POA: Diagnosis not present

## 2021-11-16 DIAGNOSIS — E782 Mixed hyperlipidemia: Secondary | ICD-10-CM | POA: Diagnosis not present

## 2021-11-16 DIAGNOSIS — E1165 Type 2 diabetes mellitus with hyperglycemia: Secondary | ICD-10-CM | POA: Diagnosis not present

## 2021-11-16 DIAGNOSIS — G990 Autonomic neuropathy in diseases classified elsewhere: Secondary | ICD-10-CM | POA: Diagnosis not present

## 2021-11-18 ENCOUNTER — Other Ambulatory Visit (HOSPITAL_COMMUNITY): Payer: Self-pay | Admitting: Family Medicine

## 2021-11-18 DIAGNOSIS — Z1231 Encounter for screening mammogram for malignant neoplasm of breast: Secondary | ICD-10-CM

## 2022-01-08 ENCOUNTER — Ambulatory Visit (HOSPITAL_COMMUNITY)
Admission: RE | Admit: 2022-01-08 | Discharge: 2022-01-08 | Disposition: A | Discharge status: Home / Self Care | Payer: 59 | Source: Ambulatory Visit | Attending: Family Medicine | Admitting: Family Medicine

## 2022-01-08 DIAGNOSIS — Z1231 Encounter for screening mammogram for malignant neoplasm of breast: Secondary | ICD-10-CM | POA: Diagnosis not present

## 2022-05-12 DIAGNOSIS — E782 Mixed hyperlipidemia: Secondary | ICD-10-CM | POA: Diagnosis not present

## 2022-05-12 DIAGNOSIS — R7303 Prediabetes: Secondary | ICD-10-CM | POA: Diagnosis not present

## 2022-05-12 DIAGNOSIS — D649 Anemia, unspecified: Secondary | ICD-10-CM | POA: Diagnosis not present

## 2022-09-08 ENCOUNTER — Encounter (HOSPITAL_COMMUNITY): Payer: Self-pay | Admitting: Emergency Medicine

## 2022-09-08 ENCOUNTER — Ambulatory Visit
Admission: EM | Admit: 2022-09-08 | Discharge: 2022-09-08 | Disposition: A | Discharge status: Home / Self Care | Payer: 59 | Attending: Nurse Practitioner | Admitting: Nurse Practitioner

## 2022-09-08 ENCOUNTER — Inpatient Hospital Stay (HOSPITAL_COMMUNITY)
Admission: EM | Admit: 2022-09-08 | Discharge: 2022-09-11 | DRG: 378 | Disposition: A | Discharge status: Home / Self Care | Payer: 59 | Source: Ambulatory Visit | Attending: Internal Medicine | Admitting: Internal Medicine

## 2022-09-08 ENCOUNTER — Other Ambulatory Visit: Payer: Self-pay

## 2022-09-08 DIAGNOSIS — R000 Tachycardia, unspecified: Secondary | ICD-10-CM

## 2022-09-08 DIAGNOSIS — E782 Mixed hyperlipidemia: Secondary | ICD-10-CM | POA: Diagnosis present

## 2022-09-08 DIAGNOSIS — F419 Anxiety disorder, unspecified: Secondary | ICD-10-CM | POA: Diagnosis present

## 2022-09-08 DIAGNOSIS — K625 Hemorrhage of anus and rectum: Secondary | ICD-10-CM | POA: Diagnosis not present

## 2022-09-08 DIAGNOSIS — I959 Hypotension, unspecified: Secondary | ICD-10-CM | POA: Diagnosis not present

## 2022-09-08 DIAGNOSIS — R42 Dizziness and giddiness: Secondary | ICD-10-CM | POA: Diagnosis not present

## 2022-09-08 DIAGNOSIS — R7303 Prediabetes: Secondary | ICD-10-CM | POA: Diagnosis present

## 2022-09-08 DIAGNOSIS — R0602 Shortness of breath: Secondary | ICD-10-CM

## 2022-09-08 DIAGNOSIS — Z531 Procedure and treatment not carried out because of patient's decision for reasons of belief and group pressure: Secondary | ICD-10-CM | POA: Diagnosis present

## 2022-09-08 DIAGNOSIS — R739 Hyperglycemia, unspecified: Secondary | ICD-10-CM | POA: Diagnosis present

## 2022-09-08 DIAGNOSIS — K921 Melena: Principal | ICD-10-CM | POA: Diagnosis present

## 2022-09-08 DIAGNOSIS — I10 Essential (primary) hypertension: Secondary | ICD-10-CM | POA: Diagnosis present

## 2022-09-08 DIAGNOSIS — Z79899 Other long term (current) drug therapy: Secondary | ICD-10-CM

## 2022-09-08 DIAGNOSIS — K922 Gastrointestinal hemorrhage, unspecified: Secondary | ICD-10-CM | POA: Diagnosis not present

## 2022-09-08 DIAGNOSIS — E785 Hyperlipidemia, unspecified: Secondary | ICD-10-CM

## 2022-09-08 DIAGNOSIS — D649 Anemia, unspecified: Secondary | ICD-10-CM | POA: Diagnosis present

## 2022-09-08 DIAGNOSIS — D62 Acute posthemorrhagic anemia: Secondary | ICD-10-CM | POA: Diagnosis present

## 2022-09-08 DIAGNOSIS — F32A Depression, unspecified: Secondary | ICD-10-CM | POA: Diagnosis present

## 2022-09-08 DIAGNOSIS — Z888 Allergy status to other drugs, medicaments and biological substances status: Secondary | ICD-10-CM

## 2022-09-08 LAB — POCT URINALYSIS DIP (MANUAL ENTRY)
Bilirubin, UA: NEGATIVE
Blood, UA: NEGATIVE
Glucose, UA: 100 mg/dL — AB
Ketones, POC UA: NEGATIVE mg/dL
Leukocytes, UA: NEGATIVE
Nitrite, UA: NEGATIVE
Protein Ur, POC: NEGATIVE mg/dL
Spec Grav, UA: 1.01 (ref 1.010–1.025)
Urobilinogen, UA: 0.2 E.U./dL
pH, UA: 6.5 (ref 5.0–8.0)

## 2022-09-08 LAB — CBC WITH DIFFERENTIAL/PLATELET
Abs Immature Granulocytes: 0.02 10*3/uL (ref 0.00–0.07)
Basophils Absolute: 0 10*3/uL (ref 0.0–0.1)
Basophils Relative: 0 %
Eosinophils Absolute: 0.1 10*3/uL (ref 0.0–0.5)
Eosinophils Relative: 1 %
HCT: 23.2 % — ABNORMAL LOW (ref 36.0–46.0)
Hemoglobin: 7.3 g/dL — ABNORMAL LOW (ref 12.0–15.0)
Immature Granulocytes: 0 %
Lymphocytes Relative: 24 %
Lymphs Abs: 1.8 10*3/uL (ref 0.7–4.0)
MCH: 27.8 pg (ref 26.0–34.0)
MCHC: 31.5 g/dL (ref 30.0–36.0)
MCV: 88.2 fL (ref 80.0–100.0)
Monocytes Absolute: 0.3 10*3/uL (ref 0.1–1.0)
Monocytes Relative: 4 %
Neutro Abs: 5.3 10*3/uL (ref 1.7–7.7)
Neutrophils Relative %: 71 %
Platelets: 273 10*3/uL (ref 150–400)
RBC: 2.63 MIL/uL — ABNORMAL LOW (ref 3.87–5.11)
RDW: 15.3 % (ref 11.5–15.5)
WBC: 7.5 10*3/uL (ref 4.0–10.5)
nRBC: 0 % (ref 0.0–0.2)

## 2022-09-08 LAB — COMPREHENSIVE METABOLIC PANEL
ALT: 42 U/L (ref 0–44)
AST: 29 U/L (ref 15–41)
Albumin: 3.4 g/dL — ABNORMAL LOW (ref 3.5–5.0)
Alkaline Phosphatase: 75 U/L (ref 38–126)
Anion gap: 7 (ref 5–15)
BUN: 9 mg/dL (ref 6–20)
CO2: 24 mmol/L (ref 22–32)
Calcium: 8.8 mg/dL — ABNORMAL LOW (ref 8.9–10.3)
Chloride: 104 mmol/L (ref 98–111)
Creatinine, Ser: 0.74 mg/dL (ref 0.44–1.00)
GFR, Estimated: >60 mL/min (ref >60)
Glucose, Bld: 222 mg/dL — ABNORMAL HIGH (ref 70–99)
Potassium: 4 mmol/L (ref 3.5–5.1)
Sodium: 135 mmol/L (ref 135–145)
Total Bilirubin: 0.3 mg/dL (ref 0.3–1.2)
Total Protein: 6.2 g/dL — ABNORMAL LOW (ref 6.5–8.1)

## 2022-09-08 LAB — RETICULOCYTES
Immature Retic Fract: 22.4 % — ABNORMAL HIGH (ref 2.3–15.9)
RBC.: 2.34 MIL/uL — ABNORMAL LOW (ref 3.87–5.11)
Retic Count, Absolute: 60.6 10*3/uL (ref 19.0–186.0)
Retic Ct Pct: 2.6 % (ref 0.4–3.1)

## 2022-09-08 LAB — POC OCCULT BLOOD, ED: Fecal Occult Bld: POSITIVE — AB

## 2022-09-08 LAB — FOLATE: Folate: 16.6 ng/mL (ref >5.9)

## 2022-09-08 LAB — PROTIME-INR
INR: 1.2 (ref 0.8–1.2)
Prothrombin Time: 15 seconds (ref 11.4–15.2)

## 2022-09-08 LAB — IRON AND TIBC
Iron: 9 ug/dL — ABNORMAL LOW (ref 28–170)
Saturation Ratios: 2 % — ABNORMAL LOW (ref 10.4–31.8)
TIBC: 373 ug/dL (ref 250–450)
UIBC: 364 ug/dL

## 2022-09-08 LAB — HIV ANTIBODY (ROUTINE TESTING W REFLEX): HIV Screen 4th Generation wRfx: NONREACTIVE

## 2022-09-08 LAB — FERRITIN: Ferritin: 4 ng/mL — ABNORMAL LOW (ref 11–307)

## 2022-09-08 LAB — POCT FASTING CBG KUC MANUAL ENTRY: POCT Glucose (KUC): 234 mg/dL — AB (ref 70–99)

## 2022-09-08 LAB — TROPONIN I (HIGH SENSITIVITY): Troponin I (High Sensitivity): 3 ng/L (ref <18)

## 2022-09-08 LAB — VITAMIN B12: Vitamin B-12: 787 pg/mL (ref 180–914)

## 2022-09-08 MED ORDER — ONDANSETRON HCL 4 MG PO TABS: 4.0000 mg | ORAL_TABLET | Freq: Four times a day (QID) | ORAL | Status: DC | PRN

## 2022-09-08 MED ORDER — ROSUVASTATIN CALCIUM 10 MG PO TABS
5.0000 mg | ORAL_TABLET | Freq: Every day | ORAL | Status: DC
  Administered 2022-09-08 – 2022-09-11 (×3): 5 mg via ORAL
  Filled 2022-09-08 (×4): qty 1

## 2022-09-08 MED ORDER — PANTOPRAZOLE SODIUM 40 MG IV SOLR
40.0000 mg | Freq: Two times a day (BID) | INTRAVENOUS | Status: DC
  Administered 2022-09-08 – 2022-09-10 (×4): 40 mg via INTRAVENOUS
  Filled 2022-09-08 (×4): qty 10

## 2022-09-08 MED ORDER — PEG 3350-KCL-NA BICARB-NACL 420 G PO SOLR
4000.0000 mL | Freq: Once | ORAL | Status: AC
  Administered 2022-09-08: 4000 mL via ORAL

## 2022-09-08 MED ORDER — SODIUM CHLORIDE 0.9 % IV BOLUS
1000.0000 mL | Freq: Once | INTRAVENOUS | Status: AC
  Administered 2022-09-08: 1000 mL via INTRAVENOUS

## 2022-09-08 MED ORDER — LACTATED RINGERS IV SOLN: INTRAVENOUS | Status: AC

## 2022-09-08 MED ORDER — ONDANSETRON HCL 4 MG/2ML IJ SOLN: 4.0000 mg | Freq: Four times a day (QID) | INTRAMUSCULAR | Status: DC | PRN

## 2022-09-08 MED ORDER — ACETAMINOPHEN 650 MG RE SUPP: 650.0000 mg | Freq: Four times a day (QID) | RECTAL | Status: DC | PRN

## 2022-09-08 MED ORDER — ACETAMINOPHEN 325 MG PO TABS: 650.0000 mg | ORAL_TABLET | Freq: Four times a day (QID) | ORAL | Status: DC | PRN

## 2022-09-08 MED ORDER — PANTOPRAZOLE SODIUM 40 MG IV SOLR
80.0000 mg | Freq: Once | INTRAVENOUS | Status: AC
  Administered 2022-09-08: 80 mg via INTRAVENOUS
  Filled 2022-09-08: qty 20

## 2022-09-08 MED ORDER — SODIUM CHLORIDE 0.9 % IV SOLN: INTRAVENOUS | Status: DC

## 2022-09-08 NOTE — ED Provider Notes (Signed)
[facility]    CSN: [id number] Arrival date & time: 09/08/22  0917      History   Chief Complaint Chief Complaint  Patient presents with   Dizziness    HPI Gloria Nelson is a 57 y.o. female.   Patient presents today with daughter for 1 day history of dizziness and nausea that began this morning when she woke up.  She reports 2-week history of rectal bleeding with every bowel movement.  No vomiting or diarrhea.  She reports symptoms are worse when she goes from sitting to standing.  No room spinning sensation, symptoms worsen with rolling over, bending over, or head movement.  No recent head injury known.  No vomiting, hearing loss, headache, double vision, difficulty speaking, difficulty swallowing.  She does feel generally weak, pale, and has a little bit of shortness of breath when she stands for too long.     Past Medical History:  Diagnosis Date   Carpal tunnel syndrome, bilateral    Depression with anxiety    History of anemia    Hypertension    Left sided sciatica    Obesity    Paresthesias    Prediabetes     Patient Active Problem List   Diagnosis Date Noted   Hemorrhoids 01/15/2016   Rectal bleeding 01/15/2016    Past Surgical History:  Procedure Laterality Date   COLONOSCOPY  06/22/2004   Images only (marked for scanning), no report.   COLONOSCOPY N/A 01/30/2016   Procedure: COLONOSCOPY;  Surgeon: Corbin Ade, MD;  Location: [facility];  Service: Endoscopy;  Laterality: N/A;  830   ENDOMETRIAL ABLATION  2006    OB History   No obstetric history on file.      Home Medications    Prior to Admission medications   Medication Sig Start Date End Date Taking? Authorizing Provider  acetaminophen (TYLENOL) 500 MG tablet Take 500-1,000 mg by mouth every 8 (eight) hours as needed for mild pain.    Yes [provider]  Calcium-Magnesium-Zinc (CAL-MAG-ZINC PO) Take 1 capsule by mouth daily.    Yes [provider]   fexofenadine (ALLEGRA ALLERGY) 180 MG tablet Take 180 mg by mouth daily as needed for allergies.    Yes [provider]  Multiple Vitamin (MULTIVITAMIN WITH MINERALS) TABS tablet Take 1 tablet by mouth daily.   Yes [provider]  rosuvastatin (CRESTOR) 5 MG tablet Take 5 mg by mouth daily. 08/30/22  Yes [provider]  buPROPion (WELLBUTRIN SR) 150 MG 12 hr tablet Take 150 mg by mouth daily. 12/21/15   [provider]  FLUoxetine (PROZAC) 20 MG capsule Take 20 mg by mouth at bedtime.  12/21/15   [provider]  psyllium (METAMUCIL) 58.6 % powder Take 1 packet by mouth daily.    [provider]  VITAMIN E PO Take 1 capsule by mouth daily.    [provider]    Family History History reviewed. No pertinent family history.  Social History Social History   Tobacco Use   Smoking status: Never   Smokeless tobacco: Never  Substance Use Topics   Alcohol use: No   Drug use: No     Allergies   Other   Review of Systems Review of Systems Per HPI  Physical Exam Triage Vital Signs ED Triage Vitals  Enc Vitals Group     BP 09/08/22 0924 133/74     Pulse Rate 09/08/22 0924 90     Resp  09/08/22 0924 18     Temp 09/08/22 0924 97.9 F (36.6 C)     Temp Source 09/08/22 0924 Oral     SpO2 09/08/22 0924 97 %     Weight --      Height --      Head Circumference --      Peak Flow --      Pain Score 09/08/22 0927 3     Pain Loc --      Pain Edu? --      Excl. in GC? --    Orthostatic VS for the past 24 hrs:  BP- Lying Pulse- Lying BP- Sitting Pulse- Sitting BP- Standing at 0 minutes Pulse- Standing at 0 minutes  09/08/22 0933 107/64 89 101/66 92 (!) 88/55 109    Updated Vital Signs BP 133/74 (BP Location: Right Arm)   Pulse 90   Temp 97.9 F (36.6 C) (Oral)   Resp 18   LMP 08/28/2015 (Approximate)   SpO2 97%   Visual Acuity Right Eye Distance:   Left Eye Distance:   Bilateral Distance:    Right Eye  Near:   Left Eye Near:    Bilateral Near:     Physical Exam Vitals and nursing note reviewed.  Constitutional:      General: She is not in acute distress.    Appearance: Normal appearance. She is not toxic-appearing.  HENT:     Head: Normocephalic and atraumatic.     Right Ear: External ear normal. There is no impacted cerumen.     Left Ear: External ear normal.     Mouth/Throat:     Mouth: Mucous membranes are moist.     Pharynx: Oropharynx is clear.  Eyes:     Extraocular Movements: Extraocular movements intact.     Right eye: Normal extraocular motion.     Left eye: Normal extraocular motion.  Cardiovascular:     Rate and Rhythm: Regular rhythm. Tachycardia present.  Pulmonary:     Effort: Pulmonary effort is normal. No respiratory distress.     Breath sounds: Normal breath sounds. No wheezing, rhonchi or rales.  Skin:    General: Skin is warm and dry.     Capillary Refill: Capillary refill takes less than 2 seconds.     Coloration: Skin is pale. Skin is not jaundiced.     Findings: No erythema.  Neurological:     Mental Status: She is alert and oriented to person, place, and time.      UC Treatments / Results  Labs (all labs ordered are listed, but only abnormal results are displayed) Labs Reviewed  POCT FASTING CBG [facility] MANUAL ENTRY - Abnormal; Notable for the following components:      Result Value   POCT Glucose ([facility]) 234 (*)    All other components within normal limits  POCT URINALYSIS DIP (MANUAL ENTRY) - Abnormal; Notable for the following components:   Glucose, UA =100 (*)    All other components within normal limits    EKG   Radiology No results found.  Procedures Procedures (including critical care time)  Medications Ordered in UC Medications - No data to display  Initial Impression / Assessment and Plan / UC Course  I have reviewed the triage vital signs and the nursing notes.  Pertinent labs & imaging results that were available during my  care of the patient were reviewed by me and considered in my medical decision making (see chart for details).   Patient is well-appearing,  normotensive, afebrile, not tachycardic, not tachypneic, oxygenating well on room air.    1. Dizziness 2. Rectal bleeding 3. Shortness of breath 4. Hypotension, unspecified hypotension type 5. Tachycardia EKG is largely unchanged when compared with previous; urinalysis today normal with exception of glucose and glucose today greater than 200 Suspect underlying diabetes Orthostatic vital signs are positive, suspect volume loss secondary to anemia I recommended further evaluation and management in emergency room Patient and daughter are in agreement to plan Patient safe to transport via private vehicle at this time  The patient was given the opportunity to ask questions.  All questions answered to their satisfaction.  The patient is in agreement to this plan.    Final Clinical Impressions(s) / UC Diagnoses   Final diagnoses:  Dizziness  Rectal bleeding  Shortness of breath  Hypotension, unspecified hypotension type  Tachycardia     Discharge Instructions      Please go directly to the emergency room for further evaluation and management.  I am concerned you are anemic from the rectal bleeding.     ED Prescriptions   None    PDMP not reviewed this encounter.   Valentino Nose, NP 09/08/22 1026

## 2022-09-08 NOTE — Consult Note (Signed)
Gastroenterology Consult   Referring Provider: [facility] Primary Care Physician:  Benita Stabile, MD Primary Gastroenterologist:  Dr. Jena Gauss  Patient ID: Gloria Nelson; [id number]; March 07, 1966   Admit date: 09/08/2022  LOS: 0 days   Date of Consultation: 09/08/2022  Reason for Consultation:  Rectal bleeding and anemia  History of Present Illness   Gloria Nelson is a 57 y.o. year old female with history of HTN, pre-diabetic, anxiety/depression, history of GI bleeding in 2006 with reportedly Hgb 4, prior hemorrhoids s/p banding in 2018, presenting with symptomatic anemia and 2 weeks of rectal bleeding. 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   Patient noted rectal bleeding with each BM for past 2 weeks. This morning, she felt dizzy around 5am and had to sit down. Husband took her to Urgent Care first, where she was found to have orthostatic vitals. She then was sent to [facility] ED.  In the ED: Hgb 7.3 (last on file 14.8 in 2017). Rectal exam in ED without fissures and was heme positive. Ferritin profoundly low at 4, iron low at 9, and sats 2%. Hemodynamically stable in ED.   Noted onset of blood with bowel movements for past 2 weeks. States large amount. Noted several times a day. Currently no abdominal pain; however, she noted vague lower abdominal pain on Wednesday and Thursday but now resolved. Nausea this morning but now resolved. No Vomiting. No diarrhea or constipation. Denies straining. No rectal pain. No black, tarry stool. Last rectal bleeding at 0500. Notes she took Advil total of 400 mg for just two days but none in last 4 days. Normally takes tylenol. No dysphagia. No chronic GERD.   2006 had similar episode in [place] and Hgb dropped to the 4 range. Underwent EGD at that time and colonoscopy. Was told she had non-cancerous polyps and hemorrhoids. Received IV iron in 2006 and had anaphylactic reaction.  Colonoscopy Nov 2017: non-bleeding internal  hemorrhoids, diverticulosis, rectal polyp. (Benign lymphoid polyp).   No FH colon cancer or polyps.   Past Medical History:  Diagnosis Date   Carpal tunnel syndrome, bilateral    Depression with anxiety    History of anemia    Hypertension    Left sided sciatica    Obesity    Paresthesias    Prediabetes     Past Surgical History:  Procedure Laterality Date   COLONOSCOPY  06/22/2004   Images only (marked for scanning), no report.   COLONOSCOPY N/A 01/30/2016   Procedure: COLONOSCOPY;  Surgeon: Corbin Ade, MD;  Location: [facility];  Service: Endoscopy;  Laterality: N/A;  830   ENDOMETRIAL ABLATION  2006    Prior to Admission medications   Medication Sig Start Date End Date Taking? Authorizing Provider  acetaminophen (TYLENOL) 500 MG tablet Take 500-1,000 mg by mouth every 8 (eight) hours as needed for mild pain.     [provider]  buPROPion (WELLBUTRIN SR) 150 MG 12 hr tablet Take 150 mg by mouth daily. 12/21/15   [provider]  Calcium-Magnesium-Zinc (CAL-MAG-ZINC PO) Take 1 capsule by mouth daily.     [provider]  fexofenadine (ALLEGRA ALLERGY) 180 MG tablet Take 180 mg by mouth daily as needed for allergies.     [provider]  FLUoxetine (PROZAC) 20 MG capsule Take 20 mg by mouth at bedtime.  12/21/15   [provider]  Multiple Vitamin (MULTIVITAMIN WITH MINERALS) TABS tablet Take 1 tablet by mouth daily.  [provider]  psyllium (METAMUCIL) 58.6 % powder Take 1 packet by mouth daily.    [provider]  rosuvastatin (CRESTOR) 5 MG tablet Take 5 mg by mouth daily. 08/30/22   [provider]  VITAMIN E PO Take 1 capsule by mouth daily.    [provider]    Current Facility-Administered Medications  Medication Dose Route Frequency Provider Last Rate Last Admin   acetaminophen (TYLENOL) tablet 650 mg  650 mg Oral Q6H PRN Tat, Onalee Hua, MD       Or   acetaminophen (TYLENOL)  suppository 650 mg  650 mg Rectal Q6H PRN Tat, Onalee Hua, MD       lactated ringers infusion   Intravenous Continuous Tat, Onalee Hua, MD       ondansetron [facility]) tablet 4 mg  4 mg Oral Q6H PRN Tat, David, MD       Or   ondansetron (ZOFRAN) injection 4 mg  4 mg Intravenous Q6H PRN Tat, Onalee Hua, MD       pantoprazole (PROTONIX) injection 40 mg  40 mg Intravenous Q12H Tat, Onalee Hua, MD       rosuvastatin (CRESTOR) tablet 5 mg  5 mg Oral Daily Tat, David, MD        Allergies as of 09/08/2022 - Review Complete 09/08/2022  Allergen Reaction Noted   Iron Anaphylaxis 09/08/2022   Other  01/28/2016    Family History  Problem Relation Age of Onset   Colon cancer Neg Hx    Colon polyps Neg Hx     Social History   Socioeconomic History   Marital status: Married    Spouse name: Not on file   Number of children: Not on file   Years of education: Not on file   Highest education level: Not on file  Occupational History   Not on file  Tobacco Use   Smoking status: Never   Smokeless tobacco: Never  Substance and Sexual Activity   Alcohol use: No   Drug use: No   Sexual activity: Not on file  Other Topics Concern   Not on file  Social History Narrative   Not on file   Social Determinants of Health   Financial Resource Strain: Not on file  Food Insecurity: No Food Insecurity (09/08/2022)   Hunger Vital Sign    Worried About Running Out of Food in the Last Year: Never true    Ran Out of Food in the Last Year: Never true  Transportation Needs: No Transportation Needs (09/08/2022)   PRAPARE - Administrator, Civil Service (Medical): No    Lack of Transportation (Non-Medical): No  Physical Activity: Not on file  Stress: Not on file  Social Connections: Not on file  Intimate Partner Violence: Not At Risk (09/08/2022)   Humiliation, Afraid, Rape, and Kick questionnaire    Fear of Current or Ex-Partner: No    Emotionally Abused: No    Physically Abused: No    Sexually Abused: No      Review of Systems   Gen: Denies any fever, chills, loss of appetite, change in weight or weight loss CV: see HPI  Resp: see HPI GI: Denies vomiting blood, jaundice, and fecal incontinence.   Denies dysphagia or odynophagia. GU : Denies urinary burning, blood in urine, urinary frequency, and urinary incontinence. MS: Denies joint pain, limitation of movement, swelling, cramps, and atrophy.  Derm: Denies rash, itching, dry skin, hives. Psych: Denies depression, anxiety, memory loss, hallucinations, and confusion. Heme: see HPI  Neuro:  see HPI  Physical Exam   Vital Signs in last 24 hours: Temp:  [97.9 F (36.6 C)-98.1 F (36.7 C)] 98.1 F (36.7 C) (07/01 1424) Pulse Rate:  [75-90] 80 (07/01 1424) Resp:  [16-18] 18 (07/01 1424) BP: (119-136)/(64-77) 136/74 (07/01 1424) SpO2:  [97 %-100 %] 100 % (07/01 1424) Weight:  [77.6 kg] 77.6 kg (07/01 1424) Last BM Date : 09/08/22  General:   Alert,  Well-developed, well-nourished, pleasant and cooperative in NAD Head:  Normocephalic and atraumatic. Eyes:  Sclera clear, no icterus.   Conjunctiva pink. Ears:  Normal auditory acuity. Mouth:  No deformity or lesions, dentition normal. Lungs:  Clear throughout to auscultation.    Heart:  S1 S2 present Abdomen:  Soft, nontender and nondistended. No masses, hepatosplenomegaly or hernias noted. Normal bowel sounds, without guarding, and without rebound.   Rectal: deferred as done in ED   Msk:  Symmetrical without gross deformities. Normal posture. Extremities:  Without edema. Neurologic:  Alert and  oriented x4. Skin:  Intact without significant lesions or rashes. Psych:  Alert and cooperative. Normal mood and affect.  Intake/Output from previous day: No intake/output data recorded. Intake/Output this shift: No intake/output data recorded.    Labs/Studies   Recent Labs Recent Labs    09/08/22 1036  WBC 7.5  HGB 7.3*  HCT 23.2*  PLT 273   BMET Recent Labs     09/08/22 1036  NA 135  K 4.0  CL 104  CO2 24  GLUCOSE 222*  BUN 9  CREATININE 0.74  CALCIUM 8.8*   LFT Recent Labs    09/08/22 1036  PROT 6.2*  ALBUMIN 3.4*  AST 29  ALT 42  ALKPHOS 75  BILITOT 0.3   PT/INR Recent Labs    09/08/22 1226  LABPROT 15.0  INR 1.2      Assessment   Gloria Nelson is a 57 y.o. year old female  with history of HTN, pre-diabetis, anxiety/depression, history of GI bleeding in 2006 with reportedly Hgb 4, prior hemorrhoids s/p banding in 2018, presenting with symptomatic anemia and 2 weeks of rectal bleeding. 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   Rectal bleeding: no anticoagulation. She does endorse taking Advil for 2 days but otherwise avoids NSAIDs. Bleeding has been painless. Brief episode of abdominal pain X 2 days last week but now resolved. Does not seem consistent with ischemic colitis or diverticular etiology in light of continued bleeding. Known prior hemorrhoids s/p banding in 2017/2018. Interestingly, she reports that bleeding hemorrhoids were the cause of her profound anemia in 2006 while in [place]. Last colonoscopy in 2017. Recommend diagnostic colonoscopy tomorrow, with possible endoscopy if colonoscopy negative.     Plan / Recommendations    Clear liquids today Follow H/H. Patient declining blood products as she is a TEFL teacher witness. She also notes anaphylactic reaction to IV iron in 2006 (unknown IV iron preparation/brand) Will plan on colonoscopy +/- EGD on 7/2 [place] this afternoon, tap water enemas in morning NPO after midnight except sips with meds Continue IV PPI    09/08/2022, 3:17 PM  Gelene Mink, PhD, ANP-BC [facility]

## 2022-09-08 NOTE — ED Notes (Signed)
Patient is being discharged from the Urgent Care and sent to the Emergency Department via [facility] . Per provider Anderson Malta., patient is in need of higher level of care due to possible anemia from rectal bleeding. Patient is aware and verbalizes understanding of plan of care.  Vitals:   09/08/22 0924  BP: 133/74  Pulse: 90  Resp: 18  Temp: 97.9 F (36.6 C)  SpO2: 97%

## 2022-09-08 NOTE — Hospital Course (Addendum)
57 year old female with a history of hypertension, impaired glucose tolerance, anxiety/depression, internal hemorrhoids presenting with dizziness and generalized weakness.  The patient began feeling dizzy when she woke up around 5 AM on 09/08/2022.  She denied any focal extremity weakness, headache, syncope.  She denies any chest pain, nausea, vomiting, diarrhea, abdominal pain.  She has had some dyspnea on exertion.  She denies any coughing or hemoptysis.  She states that she has been having hematochezia with each bowel movement for the past 2 weeks.  She went to urgent care on the morning of 09/08/2022.  At urgent care, the patient was noted to have orthostatic vital signs that were positive.  Her blood pressure dropped from 107/64 to 88/55 and heart rate went from 89 to 109 from lying to standing.  As result, the patient was directed to the emergency department for further evaluation and treatment. Notably, the patient had a colonoscopy on 01/30/2016 which revealed nonbleeding internal hemorrhoids, grade 2 with diverticulosis and a rectal polyp.  She followed up in the GI office on 05/06/2016 where she had her internal hemorrhoids banded by Dr. Jena Gauss.  She has not followed up since then.  Patient states that she took Advil for 2 days on 09/03/2022 and 09/04/2022.  She denies any melena, hematemesis.  In the ED, the patient was afebrile and hemodynamically stable with oxygen saturation 100% room air.  WBC 7.5, hemoglobin 7.3, platelets 273,000.  Sodium 135, potassium 4.0, bicarbonate 24, serum creatinine 0.74.  AST 21, ALT 42, alkaline phosphatase 75, total bilirubin 0.3.  EKG shows sinus rhythm with nonspecific T wave change.  FOBT was positive.  GI was consulted to assist with management.

## 2022-09-08 NOTE — Discharge Instructions (Signed)
Please go directly to the emergency room for further evaluation and management.  I am concerned you are anemic from the rectal bleeding.

## 2022-09-08 NOTE — ED Notes (Signed)
Pt states she is having rectal bleeding that started 2 weeks ago, she states it is like a stream of blood that comes out when she has a bowel movement. Notified provider of this and orthostatic vs results.

## 2022-09-08 NOTE — ED Provider Notes (Signed)
[place] EMERGENCY DEPARTMENT AT [facility] Provider Note   CSN: [id number] Arrival date & time: 09/08/22  1013     History  No chief complaint on file.  Patient's is at bedside and provides translation for the encounter.  Gloria Nelson is a 57 y.o. female history of anemia, hemorrhoids that required rubber banding, HTN, presenting with onset of dizziness when standing since 5 AM this morning.  She states the dizziness only when standing and she does not have the dizziness when laying or sitting.  Denies any room spinning or feeling faint at rest. She has had bright red blood in the toilet every time she is had a bowel movement for the past 2 weeks.  It is not painful when she wipes.   Denies any chest pain, shortness of breath, abdominal pain, vomiting.  Patient does not want any blood products.    HPI     Home Medications Prior to Admission medications   Medication Sig Start Date End Date Taking? Authorizing Provider  acetaminophen (TYLENOL) 500 MG tablet Take 500-1,000 mg by mouth every 8 (eight) hours as needed for mild pain.     [provider]  buPROPion (WELLBUTRIN SR) 150 MG 12 hr tablet Take 150 mg by mouth daily. 12/21/15   [provider]  Calcium-Magnesium-Zinc (CAL-MAG-ZINC PO) Take 1 capsule by mouth daily.     [provider]  fexofenadine (ALLEGRA ALLERGY) 180 MG tablet Take 180 mg by mouth daily as needed for allergies.     [provider]  FLUoxetine (PROZAC) 20 MG capsule Take 20 mg by mouth at bedtime.  12/21/15   [provider]  Multiple Vitamin (MULTIVITAMIN WITH MINERALS) TABS tablet Take 1 tablet by mouth daily.    [provider]  psyllium (METAMUCIL) 58.6 % powder Take 1 packet by mouth daily.    [provider]  rosuvastatin (CRESTOR) 5 MG tablet Take 5 mg by mouth daily. 08/30/22   [provider]  VITAMIN E PO Take 1 capsule by mouth daily.    [provider]       Allergies    Other    Review of Systems   Review of Systems  Constitutional:  Negative for chills and fever.  Gastrointestinal:  Positive for blood in stool. Negative for abdominal pain.  Neurological:  Positive for dizziness. Negative for light-headedness.    Physical Exam Updated Vital Signs BP 133/64   Pulse 75   Temp 97.9 F (36.6 C) (Oral)   Resp 17   LMP 08/28/2015 (Approximate)   SpO2 100%  Physical Exam Vitals and nursing note reviewed. Exam conducted with a chaperone present.  Constitutional:      General: She is not in acute distress.    Appearance: She is well-developed.  HENT:     Head: Normocephalic and atraumatic.  Eyes:     Conjunctiva/sclera: Conjunctivae normal.  Cardiovascular:     Rate and Rhythm: Normal rate and regular rhythm.     Heart sounds: Murmur heard.  Pulmonary:     Effort: Pulmonary effort is normal. No respiratory distress.     Breath sounds: Normal breath sounds.  Abdominal:     General: Abdomen is flat. Bowel sounds are normal.     Palpations: Abdomen is soft.     Tenderness: There is no abdominal tenderness.  Genitourinary:    Rectum: Guaiac result positive (positive).     Comments: Rectum with no anal fissures or external hemorrhoids  Musculoskeletal:        General: No swelling.  Skin:    General: Skin is warm and dry.     Capillary Refill: Capillary refill takes less than 2 seconds.  Neurological:     Mental Status: She is alert.  Psychiatric:        Mood and Affect: Mood normal.     ED Results / Procedures / Treatments   Labs (all labs ordered are listed, but only abnormal results are displayed) Labs Reviewed  COMPREHENSIVE METABOLIC PANEL - Abnormal; Notable for the following components:      Result Value   Glucose, Bld 222 (*)    Calcium 8.8 (*)    Total Protein 6.2 (*)    Albumin 3.4 (*)    All other components within normal limits  CBC WITH DIFFERENTIAL/PLATELET - Abnormal; Notable for the following  components:   RBC 2.63 (*)    Hemoglobin 7.3 (*)    HCT 23.2 (*)    All other components within normal limits  RETICULOCYTES - Abnormal; Notable for the following components:   RBC. 2.34 (*)    Immature Retic Fract 22.4 (*)    All other components within normal limits  POC OCCULT BLOOD, ED - Abnormal; Notable for the following components:   Fecal Occult Bld POSITIVE (*)    All other components within normal limits  PROTIME-INR  VITAMIN B12  FOLATE  IRON AND TIBC  FERRITIN  TROPONIN I (HIGH SENSITIVITY)    EKG None  Radiology No results found.  Procedures Procedures    Medications Ordered in ED Medications  sodium chloride 0.9 % bolus 1,000 mL (0 mLs Intravenous Stopped 09/08/22 1232)  pantoprazole (PROTONIX) injection 80 mg (80 mg Intravenous Given 09/08/22 1232)    ED Course/ Medical Decision Making/ A&P                             Medical Decision Making Amount and/or Complexity of Data Reviewed Labs: ordered.  Risk Prescription drug management. Decision regarding hospitalization.   57 y.o. female with pertinent past medical history of hemorrhoids requiring rubber banding, anemia, presents to the ED for concern of dizziness upon standing that started this morning and rectal bleeding for 2 weeks   Differential diagnosis includes but is not limited to diverticulosis, hemorrhoid, anal fissure, iron deficiency anemia, other GI bleed, PE, ACS.   ED Course:  Patient overall well appearing in no acute distress, Vital signs stable. Low suspicion for PE or ACS given no chest pain or shortness of breath and recent GI bleed that likely is contributing to dizziness, however will evaluate for ACS with troponin and EKG. Do not feel PE workup needed at this time given no tachycardia or shortness of breath. Guaiac positive, will obtain CMP, CBC to further evaluate. Patient started on fluids and Protonix. Patient does not want any blood products should they be needed Patient  with low Hgb at 7.3, guaiac positive, suspect uncontrolled GI bleed and possible hemorrhoid given history. No concern for ACS with EKG with NSR and troponin of 3. Will plan to admit.    Impression: GI bleed Dizziness Low hemoglobin  Disposition:  Admitted to hospitalist service with GI Dr. Jena Gauss following   Lab Tests: I Ordered, and personally interpreted labs.  The pertinent results include:   CBC with a hemoglobin of 7.3, RBC of 2.63 Guaiac positive  CMP largely within normal limits, elevated glucose of 222 Troponin 3  Imaging Studies ordered: none   Cardiac Monitoring: / EKG: The patient was maintained on a cardiac monitor.  I personally viewed and interpreted the EKG which showed an underlying rhythm of: NSR   Consultations Obtained: I requested consultation with hospitalist who recommends admission.  Requested consultation with gastroenterologist Dr. Jena Gauss,  and discussed lab and imaging findings as well as pertinent plan - they will follow patient following hospitalist admission    External records from outside source obtained and reviewed including  EMR showing patient has been treated for hemorrhoids by Dr. Jena Gauss in 2018   Co morbidities that complicate the patient evaluation  History of hemorrhoids requiring banding, HTN  Social Determinants of Health:  Unknown              Final Clinical Impression(s) / ED Diagnoses Final diagnoses:  Gastrointestinal hemorrhage, unspecified gastrointestinal hemorrhage type  Hemoglobin low    Rx / DC Orders ED Discharge Orders     None         Maciel, Langlois, PA-C 09/08/22 1306    Sloan Leiter, DO 09/11/22 [phone]

## 2022-09-08 NOTE — ED Notes (Signed)
ED TO INPATIENT HANDOFF REPORT  ED Nurse Name and Phone #: Wandra Mannan, Paramedic [phone]  S Name/Age/Gender Gloria Nelson 57 y.o. female Room/Bed: APA08/APA08  Code Status   Code Status: Full Code  Home/SNF/Other Home Patient oriented to: self, place, time, and situation Is this baseline? Yes   Triage Complete: Triage complete  Chief Complaint Symptomatic anemia [D64.9]  Triage Note Pt sent from UC due to bloody stools x 2 weeks. Pt also reports, dizziness, SHOB, and L abd pain.    Allergies Allergies  Allergen Reactions   Other     No blood products    Level of Care/Admitting Diagnosis ED Disposition     ED Disposition  Admit   Condition  --   Comment  Hospital Area: [facility] [[id number]]  Level of Care: Med-Surg [16]  Covid Evaluation: Asymptomatic - no recent exposure (last 10 days) testing not required  Diagnosis: Symptomatic anemia [4540981]  Admitting Physician: TAT, DAVID [email or web address]  Attending Physician: TAT, DAVID [[id number]]          B Medical/Surgery History Past Medical History:  Diagnosis Date   Carpal tunnel syndrome, bilateral    Depression with anxiety    History of anemia    Hypertension    Left sided sciatica    Obesity    Paresthesias    Prediabetes    Past Surgical History:  Procedure Laterality Date   COLONOSCOPY  06/22/2004   Images only (marked for scanning), no report.   COLONOSCOPY N/A 01/30/2016   Procedure: COLONOSCOPY;  Surgeon: Corbin Ade, MD;  [facility];  Service: Endoscopy;  Laterality: N/A;  830   ENDOMETRIAL ABLATION  2006     A IV Location/Drains/Wounds Patient Lines/Drains/Airways Status     Active Line/Drains/Airways     Name Placement date Placement time Site Days   Peripheral IV 09/08/22 20 G 1" Anterior;Proximal;Right Forearm 09/08/22  1122  Forearm  less than 1            Intake/Output Last 24 hours No intake or output data in the 24 hours ending 09/08/22  1355  Labs/Imaging Results for orders placed or performed during the hospital encounter of 09/08/22 (from the past 48 hour(s))  Comprehensive metabolic panel     Status: Abnormal   Collection Time: 09/08/22 10:36 AM  Result Value Ref Range   Sodium 135 135 - 145 mmol/L   Potassium 4.0 3.5 - 5.1 mmol/L   Chloride 104 98 - 111 mmol/L   CO2 24 22 - 32 mmol/L   Glucose, Bld 222 (H) 70 - 99 mg/dL    Comment: Glucose reference range applies only to samples taken after fasting for at least 8 hours.   BUN 9 6 - 20 mg/dL   Creatinine, Ser 1.91 0.44 - 1.00 mg/dL   Calcium 8.8 (L) 8.9 - 10.3 mg/dL   Total Protein 6.2 (L) 6.5 - 8.1 g/dL   Albumin 3.4 (L) 3.5 - 5.0 g/dL   AST 29 15 - 41 U/L   ALT 42 0 - 44 U/L   Alkaline Phosphatase 75 38 - 126 U/L   Total Bilirubin 0.3 0.3 - 1.2 mg/dL   GFR, Estimated >47 >82 mL/min    Comment: (NOTE) Calculated using the CKD-EPI Creatinine Equation (2021)    Anion gap 7 5 - 15    Comment: Performed at [facility], [address] [place], [place] [address]  CBC with Differential     Status: Abnormal   Collection  Time: 09/08/22 10:36 AM  Result Value Ref Range   WBC 7.5 4.0 - 10.5 K/uL   RBC 2.63 (L) 3.87 - 5.11 MIL/uL   Hemoglobin 7.3 (L) 12.0 - 15.0 g/dL   HCT 40.9 (L) 81.1 - 91.4 %   MCV 88.2 80.0 - 100.0 fL   MCH 27.8 26.0 - 34.0 pg   MCHC 31.5 30.0 - 36.0 g/dL   RDW 78.2 95.6 - 21.3 %   Platelets 273 150 - 400 K/uL   nRBC 0.0 0.0 - 0.2 %   Neutrophils Relative % 71 %   Neutro Abs 5.3 1.7 - 7.7 K/uL   Lymphocytes Relative 24 %   Lymphs Abs 1.8 0.7 - 4.0 K/uL   Monocytes Relative 4 %   Monocytes Absolute 0.3 0.1 - 1.0 K/uL   Eosinophils Relative 1 %   Eosinophils Absolute 0.1 0.0 - 0.5 K/uL   Basophils Relative 0 %   Basophils Absolute 0.0 0.0 - 0.1 K/uL   Immature Granulocytes 0 %   Abs Immature Granulocytes 0.02 0.00 - 0.07 K/uL    Comment: Performed at [facility], [address], [place], [place] [address]  Troponin I (High  Sensitivity)     Status: None   Collection Time: 09/08/22 10:36 AM  Result Value Ref Range   Troponin I (High Sensitivity) 3 <18 ng/L    Comment: (NOTE) Elevated high sensitivity troponin I (hsTnI) values and significant  changes across serial measurements may suggest ACS but many other  chronic and acute conditions are known to elevate hsTnI results.  Refer to the "Links" section for chest pain algorithms and additional  guidance. Performed at [facility], [address], [place], [place] [address]   POC occult blood, ED     Status: Abnormal   Collection Time: 09/08/22 10:48 AM  Result Value Ref Range   Fecal Occult Bld POSITIVE (A) NEGATIVE  Protime-INR     Status: None   Collection Time: 09/08/22 12:26 PM  Result Value Ref Range   Prothrombin Time 15.0 11.4 - 15.2 seconds   INR 1.2 0.8 - 1.2    Comment: (NOTE) INR goal varies based on device and disease states. Performed at [facility], [address] [place], [place] [address]   Vitamin B12     Status: None   Collection Time: 09/08/22 12:26 PM  Result Value Ref Range   Vitamin B-12 787 180 - 914 pg/mL    Comment: (NOTE) This assay is not validated for testing neonatal or myeloproliferative syndrome specimens for Vitamin B12 levels. Performed at [facility], [address] [place], [place] [address]   Folate     Status: None   Collection Time: 09/08/22 12:26 PM  Result Value Ref Range   Folate 16.6 >5.9 ng/mL    Comment: Performed at [facility], [address] [place], [place] [address]  Iron and TIBC     Status: Abnormal   Collection Time: 09/08/22 12:26 PM  Result Value Ref Range   Iron 9 (L) 28 - 170 ug/dL   TIBC 725 366 - 440 ug/dL   Saturation Ratios 2 (L) 10.4 - 31.8 %   UIBC 364 ug/dL    Comment: Performed at [facility], [address] [place], [place] [address]  Ferritin     Status: Abnormal   Collection Time: 09/08/22 12:26 PM  Result Value Ref Range   Ferritin 4 (L) 11 - 307 ng/mL    Comment:  Performed at [facility], [address], [place], [place] [address]  Reticulocytes     Status: Abnormal   Collection Time: 09/08/22 12:26 PM  Result Value Ref Range   Retic Ct Pct 2.6 0.4 - 3.1 %   RBC. 2.34 (L) 3.87 - 5.11 MIL/uL   Retic Count, Absolute 60.6 19.0 - 186.0 K/uL   Immature Retic Fract 22.4 (H) 2.3 - 15.9 %    Comment: Performed at [facility], [address] [place], [place] [address]   No results found.  Pending Labs [employer] (From admission, onward)     Start     Ordered   Signed and Held  HIV Antibody (routine testing w rflx)  (HIV Antibody (Routine testing w reflex) panel)  Once,   R        Signed and Held   Signed and Held  Basic metabolic panel  Tomorrow morning,   R        Signed and Held   Signed and Held  CBC  Tomorrow morning,   R        Signed and Held   Signed and Held  Magnesium  Tomorrow morning,   R        Signed and Held   Signed and Held  Iron and TIBC  Once,   R        Signed and Held   Signed and Held  Ferritin  Once,   R        Signed and Held   Signed and Held  Vitamin B12  Once,   R        Signed and Held   Signed and Held  Folate  Once,   R        Signed and Held   Signed and Held  Hemoglobin A1c  Once,   R        Signed and Held   Signed and Held  Lipid panel  Tomorrow morning,   R        Signed and Held            Vitals/Pain Today's Vitals   09/08/22 1025 09/08/22 1049 09/08/22 1345  BP: 133/64  119/77  Pulse: 75  78  Resp: 17  16  Temp:  97.9 F (36.6 C)   TempSrc:  Oral   SpO2: 100%  100%    Isolation Precautions No active isolations  Medications Medications  sodium chloride 0.9 % bolus 1,000 mL (0 mLs Intravenous Stopped 09/08/22 1232)  pantoprazole (PROTONIX) injection 80 mg (80 mg Intravenous Given 09/08/22 1232)    Mobility walks     Focused Assessments Cardiac Assessment Handoff:    Lab Results  Component Value Date   TROPONINI <0.03 06/13/2015   No results found for: "DDIMER" Does the  Patient currently have chest pain? No    R Recommendations: See Admitting Provider Note  Report given to:   Additional Notes: Ambulatory, Aox4 (needs spanish interpreter), continent, 20ga RAC.

## 2022-09-08 NOTE — ED Triage Notes (Signed)
Pt sent from UC due to bloody stools x 2 weeks. Pt also reports, dizziness, SHOB, and L abd pain.

## 2022-09-08 NOTE — H&P (Signed)
History and Physical    Patient: Gloria Nelson GNF:[id number] DOB: Mar 25, 1965 DOA: 09/08/2022 DOS: the patient was seen and examined on 09/08/2022 PCP: Benita Stabile, MD  Patient coming from: Home  Chief Complaint: dizziness, hematochezia  HPI: Gloria Nelson is 57 year old female with a history of hypertension, impaired glucose tolerance, anxiety/depression, internal hemorrhoids presenting with dizziness and generalized weakness.  The patient began feeling dizzy when she woke up around 5 AM on 09/08/2022.  She denied any focal extremity weakness, headache, syncope.  She denies any chest pain, nausea, vomiting, diarrhea, abdominal pain.  She has had some dyspnea on exertion.  She denies any coughing or hemoptysis.  She states that she has been having hematochezia with each bowel movement for the past 2 weeks.  She went to urgent care on the morning of 09/08/2022.  At urgent care, the patient was noted to have orthostatic vital signs that were positive.  Her blood pressure dropped from 107/64 to 88/55 and heart rate went from 89 to 109 from lying to standing.  As result, the patient was directed to the emergency department for further evaluation and treatment. Notably, the patient had a colonoscopy on 01/30/2016 which revealed nonbleeding internal hemorrhoids, grade 2 with diverticulosis and a rectal polyp.  She followed up in the GI office on 05/06/2016 where she had her internal hemorrhoids banded by Dr. Jena Gauss.  She has not followed up since then.  Patient states that she took Advil for 2 days on 09/03/2022 and 09/04/2022.  She denies any melena, hematemesis.  In the ED, the patient was afebrile and hemodynamically stable with oxygen saturation 100% room air.  WBC 7.5, hemoglobin 7.3, platelets 273,000.  Sodium 135, potassium 4.0, bicarbonate 24, serum creatinine 0.74.  AST 21, ALT 42, alkaline phosphatase 75, total bilirubin 0.3.  EKG shows sinus rhythm with nonspecific T wave change.  FOBT was positive.  GI  was consulted to assist with management.   Review of Systems: As mentioned in the history of present illness. All other systems reviewed and are negative. Past Medical History:  Diagnosis Date   Carpal tunnel syndrome, bilateral    Depression with anxiety    History of anemia    Hypertension    Left sided sciatica    Obesity    Paresthesias    Prediabetes    Past Surgical History:  Procedure Laterality Date   COLONOSCOPY  06/22/2004   Images only (marked for scanning), no report.   COLONOSCOPY N/A 01/30/2016   Procedure: COLONOSCOPY;  Surgeon: Corbin Ade, MD;  [facility];  Service: Endoscopy;  Laterality: N/A;  830   ENDOMETRIAL ABLATION  2006   Social History:  reports that she has never smoked. She has never used smokeless tobacco. She reports that she does not drink alcohol and does not use drugs.  Allergies  Allergen Reactions   Other     No blood products    History reviewed. No pertinent family history.  Prior to Admission medications   Medication Sig Start Date End Date Taking? Authorizing Provider  acetaminophen (TYLENOL) 500 MG tablet Take 500-1,000 mg by mouth every 8 (eight) hours as needed for mild pain.     [provider]  buPROPion (WELLBUTRIN SR) 150 MG 12 hr tablet Take 150 mg by mouth daily. 12/21/15   [provider]  Calcium-Magnesium-Zinc (CAL-MAG-ZINC PO) Take 1 capsule by mouth daily.     [provider]  fexofenadine (ALLEGRA ALLERGY) 180 MG tablet Take  180 mg by mouth daily as needed for allergies.     [provider]  FLUoxetine (PROZAC) 20 MG capsule Take 20 mg by mouth at bedtime.  12/21/15   [provider]  Multiple Vitamin (MULTIVITAMIN WITH MINERALS) TABS tablet Take 1 tablet by mouth daily.    [provider]  psyllium (METAMUCIL) 58.6 % powder Take 1 packet by mouth daily.    [provider]  rosuvastatin (CRESTOR) 5 MG tablet Take 5 mg by mouth daily. 08/30/22    [provider]  VITAMIN E PO Take 1 capsule by mouth daily.    [provider]    Physical Exam: Vitals:   09/08/22 1025 09/08/22 1049  BP: 133/64   Pulse: 75   Resp: 17   Temp:  97.9 F (36.6 C)  TempSrc:  Oral  SpO2: 100%    GENERAL:  A&O x 3, NAD, well developed, cooperative, follows commands HEENT: [place]/AT, No thrush, No icterus, No oral ulcers Neck:  No neck mass, No meningismus, soft, supple CV: RRR, no S3, no S4, no rub, no JVD Lungs:  CTA, no wheeze, no rhonchi, good air movement Abd: soft/NT +BS, nondistended Ext: No edema, no lymphangitis, no cyanosis, no rashes Neuro:  CN II-XII intact, strength 4/5 in RUE, RLE, strength 4/5 LUE, LLE; sensation intact bilateral; no dysmetria; babinski equivocal  Data Reviewed: Data reviewed above in the history  Assessment and Plan: Symptomatic anemia/hematochezia -GI consulted -Check iron studies -Patient hemoglobin of 14.8 on 06/13/2015 -Presented with hemoglobin 7.3 -start pantoprazole -clear liquids pending GI eval -she is a Jehovah Witness  Mixed hyperlipidemia -Continue statin  Anxiety/depression -no longer taking bupropion and fluoxetine  Hyperglycemia -Check hemoglobin A1c   Advance Care Planning: FULL CODE  Consults: GI  Family Communication: daughter  Severity of Illness: The appropriate patient status for this patient is OBSERVATION. Observation status is judged to be reasonable and necessary in order to provide the required intensity of service to ensure the patient's safety. The patient's presenting symptoms, physical exam findings, and initial radiographic and laboratory data in the context of their medical condition is felt to place them at decreased risk for further clinical deterioration. Furthermore, it is anticipated that the patient will be medically stable for discharge from the hospital within 2 midnights of admission.   Author: Catarina Hartshorn, MD 09/08/2022 1:38 PM  For on call  review [email or web address].

## 2022-09-08 NOTE — ED Triage Notes (Signed)
Pt states she got dizzy this morning when standing, she also had numbness in her arms and pain in right shoulder with nausea. Pt states the arm numbness started 2 months ago and comes and goes but the dizziness and nausea started this morning and is new.

## 2022-09-09 ENCOUNTER — Inpatient Hospital Stay (HOSPITAL_COMMUNITY): Payer: 59

## 2022-09-09 ENCOUNTER — Encounter (HOSPITAL_COMMUNITY)
Admission: EM | Disposition: A | Discharge status: Home / Self Care | Payer: Self-pay | Source: Ambulatory Visit | Attending: Internal Medicine

## 2022-09-09 DIAGNOSIS — R7303 Prediabetes: Secondary | ICD-10-CM | POA: Diagnosis present

## 2022-09-09 DIAGNOSIS — Z79899 Other long term (current) drug therapy: Secondary | ICD-10-CM | POA: Diagnosis not present

## 2022-09-09 DIAGNOSIS — K921 Melena: Secondary | ICD-10-CM | POA: Diagnosis not present

## 2022-09-09 DIAGNOSIS — Z9189 Other specified personal risk factors, not elsewhere classified: Secondary | ICD-10-CM | POA: Diagnosis not present

## 2022-09-09 DIAGNOSIS — F32A Depression, unspecified: Secondary | ICD-10-CM | POA: Diagnosis not present

## 2022-09-09 DIAGNOSIS — I700 Atherosclerosis of aorta: Secondary | ICD-10-CM | POA: Diagnosis not present

## 2022-09-09 DIAGNOSIS — D649 Anemia, unspecified: Secondary | ICD-10-CM | POA: Diagnosis not present

## 2022-09-09 DIAGNOSIS — I10 Essential (primary) hypertension: Secondary | ICD-10-CM | POA: Diagnosis not present

## 2022-09-09 DIAGNOSIS — F419 Anxiety disorder, unspecified: Secondary | ICD-10-CM | POA: Diagnosis not present

## 2022-09-09 DIAGNOSIS — K625 Hemorrhage of anus and rectum: Secondary | ICD-10-CM | POA: Diagnosis not present

## 2022-09-09 DIAGNOSIS — R739 Hyperglycemia, unspecified: Secondary | ICD-10-CM | POA: Diagnosis not present

## 2022-09-09 DIAGNOSIS — R42 Dizziness and giddiness: Secondary | ICD-10-CM | POA: Diagnosis not present

## 2022-09-09 DIAGNOSIS — Z888 Allergy status to other drugs, medicaments and biological substances status: Secondary | ICD-10-CM | POA: Diagnosis not present

## 2022-09-09 DIAGNOSIS — K922 Gastrointestinal hemorrhage, unspecified: Secondary | ICD-10-CM | POA: Diagnosis not present

## 2022-09-09 DIAGNOSIS — E785 Hyperlipidemia, unspecified: Secondary | ICD-10-CM | POA: Diagnosis not present

## 2022-09-09 DIAGNOSIS — D62 Acute posthemorrhagic anemia: Secondary | ICD-10-CM | POA: Diagnosis not present

## 2022-09-09 DIAGNOSIS — E782 Mixed hyperlipidemia: Secondary | ICD-10-CM | POA: Diagnosis not present

## 2022-09-09 DIAGNOSIS — Z531 Procedure and treatment not carried out because of patient's decision for reasons of belief and group pressure: Secondary | ICD-10-CM | POA: Diagnosis not present

## 2022-09-09 LAB — LIPID PANEL
Cholesterol: 108 mg/dL (ref 0–200)
HDL: 37 mg/dL — ABNORMAL LOW (ref >40)
LDL Cholesterol: 51 mg/dL (ref 0–99)
Total CHOL/HDL Ratio: 2.9 RATIO
Triglycerides: 101 mg/dL (ref <150)
VLDL: 20 mg/dL (ref 0–40)

## 2022-09-09 LAB — MAGNESIUM: Magnesium: 1.9 mg/dL (ref 1.7–2.4)

## 2022-09-09 LAB — CBC
HCT: 17.7 % — ABNORMAL LOW (ref 36.0–46.0)
Hemoglobin: 5.6 g/dL — CL (ref 12.0–15.0)
MCH: 28.1 pg (ref 26.0–34.0)
MCHC: 31.6 g/dL (ref 30.0–36.0)
MCV: 88.9 fL (ref 80.0–100.0)
Platelets: 241 10*3/uL (ref 150–400)
RBC: 1.99 MIL/uL — ABNORMAL LOW (ref 3.87–5.11)
RDW: 15.9 % — ABNORMAL HIGH (ref 11.5–15.5)
WBC: 7.8 10*3/uL (ref 4.0–10.5)
nRBC: 0 % (ref 0.0–0.2)

## 2022-09-09 LAB — BASIC METABOLIC PANEL
Anion gap: 7 (ref 5–15)
BUN: 7 mg/dL (ref 6–20)
CO2: 23 mmol/L (ref 22–32)
Calcium: 8.1 mg/dL — ABNORMAL LOW (ref 8.9–10.3)
Chloride: 109 mmol/L (ref 98–111)
Creatinine, Ser: 0.67 mg/dL (ref 0.44–1.00)
GFR, Estimated: >60 mL/min (ref >60)
Glucose, Bld: 171 mg/dL — ABNORMAL HIGH (ref 70–99)
Potassium: 3.9 mmol/L (ref 3.5–5.1)
Sodium: 139 mmol/L (ref 135–145)

## 2022-09-09 LAB — HEMOGLOBIN A1C
Hgb A1c MFr Bld: 6.8 % — ABNORMAL HIGH (ref 4.8–5.6)
Mean Plasma Glucose: 148 mg/dL

## 2022-09-09 LAB — TYPE AND SCREEN
ABO/RH(D): O POS
Antibody Screen: NEGATIVE

## 2022-09-09 LAB — ABO/RH: ABO/RH(D): O POS

## 2022-09-09 SURGERY — COLONOSCOPY WITH PROPOFOL
Anesthesia: Monitor Anesthesia Care

## 2022-09-09 MED ORDER — IOHEXOL 350 MG/ML SOLN
100.0000 mL | Freq: Once | INTRAVENOUS | Status: AC | PRN
  Administered 2022-09-09: 100 mL via INTRAVENOUS

## 2022-09-09 MED ORDER — SODIUM CHLORIDE 0.9 % IV SOLN
250.0000 mg | Freq: Once | INTRAVENOUS | Status: AC
  Administered 2022-09-09: 250 mg via INTRAVENOUS
  Filled 2022-09-09: qty 250

## 2022-09-09 MED ORDER — PNEUMOCOCCAL 20-VAL CONJ VACC 0.5 ML IM SUSY: 0.5000 mL | PREFILLED_SYRINGE | INTRAMUSCULAR | Status: DC

## 2022-09-09 MED ORDER — DARBEPOETIN ALFA 40 MCG/0.4ML IJ SOSY
100.0000 ug | PREFILLED_SYRINGE | Freq: Once | INTRAMUSCULAR | Status: AC
  Administered 2022-09-09: 100 ug via SUBCUTANEOUS
  Filled 2022-09-09: qty 1.2

## 2022-09-09 NOTE — Progress Notes (Signed)
PROGRESS NOTE  Gloria Nelson ZOX:[id number] DOB: [birth year] DOA: 09/08/2022 PCP: Benita Stabile, MD  Brief History:  57 year old female with a history of hypertension, impaired glucose tolerance, anxiety/depression, internal hemorrhoids presenting with dizziness and generalized weakness.  The patient began feeling dizzy when she woke up around 5 AM on 09/08/2022.  She denied any focal extremity weakness, headache, syncope.  She denies any chest pain, nausea, vomiting, diarrhea, abdominal pain.  She has had some dyspnea on exertion.  She denies any coughing or hemoptysis.  She states that she has been having hematochezia with each bowel movement for the past 2 weeks.  She went to urgent care on the morning of 09/08/2022.  At urgent care, the patient was noted to have orthostatic vital signs that were positive.  Her blood pressure dropped from 107/64 to 88/55 and heart rate went from 89 to 109 from lying to standing.  As result, the patient was directed to the emergency department for further evaluation and treatment. Notably, the patient had a colonoscopy on 01/30/2016 which revealed nonbleeding internal hemorrhoids, grade 2 with diverticulosis and a rectal polyp.  She followed up in the GI office on 05/06/2016 where she had her internal hemorrhoids banded by Dr. Jena Gauss.  She has not followed up since then.  Patient states that she took Advil for 2 days on 09/03/2022 and 09/04/2022.  She denies any melena, hematemesis.  In the ED, the patient was afebrile and hemodynamically stable with oxygen saturation 100% room air.  WBC 7.5, hemoglobin 7.3, platelets 273,000.  Sodium 135, potassium 4.0, bicarbonate 24, serum creatinine 0.74.  AST 21, ALT 42, alkaline phosphatase 75, total bilirubin 0.3.  EKG shows sinus rhythm with nonspecific T wave change.  FOBT was positive.  GI was consulted to assist with management.   Assessment/Plan: Symptomatic anemia/hematochezia -GI consulted -Check iron studies -Patient  hemoglobin of 14.8 on 06/13/2015 -Presented with hemoglobin 7.3 -continue pantoprazole -7/2--Hgb down to 5.6 partly diluation -Anesthesia states pt need to have Hgb >7 to have endoscopy -pt is a Jehovah Witness -discussed risks/benefits and alternatives with patient--refuses PRBC and understands risks including but not limited to cardiac arrest and death -give Aranesp 100 mcg--pt accepts risks after discussing risks/benefits -pt states she is able to receive IV iron and accepts the risks   Mixed hyperlipidemia -Continue statin   Anxiety/depression -no longer taking bupropion and fluoxetine   Hyperglycemia -Check hemoglobin A1c--6.8 -will need to follow up with PCP to start DM regimen after d/c       Family Communication:   Family at bedside updated 7/2  Consultants:  GI  Code Status:  FULL   DVT Prophylaxis:  SCDs   Procedures: As Listed in Progress Note Above  Antibiotics: None      Subjective: Patient denies fevers, chills, headache, chest pain, dyspnea, nausea, vomiting, diarrhea, abdominal pain, dysuria, hematuria, hematochezia, and melena.   Objective: Vitals:   09/08/22 2202 09/09/22 0302 09/09/22 1152 09/09/22 1243  BP: 129/74 105/67 131/75 133/71  Pulse:  95 90 95  Resp: 17 17 16 18   Temp: 98.7 F (37.1 C) 99 F (37.2 C)  97.9 F (36.6 C)  TempSrc: Oral Oral    SpO2: 100% 97% 97% 100%  Weight:      Height:        Intake/Output Summary (Last 24 hours) at 09/09/2022 1301 Last data filed at 09/09/2022 0342 Gross per 24 hour  Intake 722.24 ml  Output --  Net 722.24 ml   Weight change:  Exam:  General:  Pt is alert, follows commands appropriately, not in acute distress HEENT: No icterus, No thrush, No neck mass, [place]/AT Cardiovascular: RRR, S1/S2, no rubs, no gallops Respiratory: CTA bilaterally, no wheezing, no crackles, no rhonchi Abdomen: Soft/+BS, non tender, non distended, no guarding Extremities: No edema, No lymphangitis, No petechiae,  No rashes, no synovitis   Data Reviewed: I have personally reviewed following labs and imaging studies Basic Metabolic Panel: Recent Labs  Lab 09/08/22 1036 09/09/22 0442  NA 135 139  K 4.0 3.9  CL 104 109  CO2 24 23  GLUCOSE 222* 171*  BUN 9 7  CREATININE 0.74 0.67  CALCIUM 8.8* 8.1*  MG  --  1.9   Liver Function Tests: Recent Labs  Lab 09/08/22 1036  AST 29  ALT 42  ALKPHOS 75  BILITOT 0.3  PROT 6.2*  ALBUMIN 3.4*   No results for input(s): "LIPASE", "AMYLASE" in the last 168 hours. No results for input(s): "AMMONIA" in the last 168 hours. Coagulation Profile: Recent Labs  Lab 09/08/22 1226  INR 1.2   CBC: Recent Labs  Lab 09/08/22 1036 09/09/22 0442  WBC 7.5 7.8  NEUTROABS 5.3  --   HGB 7.3* 5.6*  HCT 23.2* 17.7*  MCV 88.2 88.9  PLT 273 241   Cardiac Enzymes: No results for input(s): "CKTOTAL", "CKMB", "CKMBINDEX", "TROPONINI" in the last 168 hours. BNP: Invalid input(s): "POCBNP" CBG: No results for input(s): "GLUCAP" in the last 168 hours. HbA1C: Recent Labs    09/08/22 1036  HGBA1C 6.8*   Urine analysis:    Component Value Date/Time   BILIRUBINUR negative 09/08/2022 0953   KETONESUR negative 09/08/2022 0953   PROTEINUR negative 09/08/2022 0953   UROBILINOGEN 0.2 09/08/2022 0953   NITRITE Negative 09/08/2022 0953   LEUKOCYTESUR Negative 09/08/2022 0953   Sepsis Labs: @LABRCNTIP (procalcitonin:4,lacticidven:4) )No results found for this or any previous visit (from the past 240 hour(s)).   Scheduled Meds:  Darbepoetin Alfa  100 mcg Subcutaneous Once   pantoprazole (PROTONIX) IV  40 mg Intravenous Q12H   [START ON 09/10/2022] pneumococcal 20-valent conjugate vaccine  0.5 mL Intramuscular Tomorrow-1000   rosuvastatin  5 mg Oral Daily   Continuous Infusions:  ferric gluconate (FERRLECIT) IVPB     lactated ringers 50 mL/hr at 09/08/22 1610    Procedures/Studies: No results found.  Catarina Hartshorn, DO  [facility]  If 7PM-7AM,  please contact night-coverage [email or web address] Password [email or web address] 09/09/2022, 1:01 PM   LOS: 0 days

## 2022-09-09 NOTE — Progress Notes (Signed)
Transition of Care [facility]) - Inpatient Brief Assessment   Patient Details  Name: Gloria Nelson MRN: [id number] Date of Birth: [birth year]  Transition of Care [facility]) CM/SW Contact:    Annice Needy, LCSW Phone Number: 09/09/2022, 12:15 PM   Clinical Narrative: Transition of Care Department [facility]) has reviewed patient and no TOC needs have been identified at this time. We will continue to monitor patient advancement through interdisciplinary progression rounds. If new patient transition needs arise, please place a TOC consult.   Transition of Care Asessment: Insurance and Status: Insurance coverage has been reviewed Patient has primary care physician: Yes Home environment has been reviewed: from home with spouse Prior level of function:: independent Prior/Current Home Services: No current home services Social Determinants of Health Reivew: SDOH reviewed no interventions necessary Readmission risk has been reviewed: Yes Transition of care needs: no transition of care needs at this time

## 2022-09-09 NOTE — Progress Notes (Signed)
Date and time results received: 09/09/22 0645 (use smartphrase ".now" to insert current time)  Test: hgb Critical Value: 5.6  Name of Provider Notified: Carren Rang  Orders Received? Or Actions Taken?: awaiting new orders

## 2022-09-09 NOTE — Progress Notes (Signed)
Subjective: Had plan for colonoscopy today, but hemoglobin declined to 5.6 and colonoscopy was canceled.  Patient refusing all blood products.  She is asking if we can give her IV iron.  Discussed the fact that she reported anaphylactic reaction to IV iron 2006.  She states they ended up, "taking something out of the iron" and then she was able to tolerate it.  This was in [place].  Also reports there is a committee for [employer] Witnesses regarding no blood products that can be given to help boost hemoglobin.  They have the contact for this information Dr. Arbutus Leas would like it.  They mention EPO.  Regarding her rectal bleeding, this has slowed down.  Her last episode of bleeding was this morning around 6 AM and was much lighter in color.  Denies abdominal pain.  Had an episode of vomiting by drinking her prep last night, but no recurrent issues with this.  No melena.  Objective: Vital signs in last 24 hours: Temp:  [98.1 F (36.7 C)-99 F (37.2 C)] 99 F (37.2 C) (07/02 0302) Pulse Rate:  [78-95] 95 (07/02 0302) Resp:  [14-18] 17 (07/02 0302) BP: (105-148)/(67-77) 105/67 (07/02 0302) SpO2:  [97 %-100 %] 97 % (07/02 0302) Weight:  [77.6 kg] 77.6 kg (07/01 1424) Last BM Date : 09/08/22 General:   Alert and oriented, pleasant, NAD.  Abdomen:  Bowel sounds present, soft, non-tender, non-distended. No HSM or hernias noted. No rebound or guarding. No masses appreciated  Msk:  Symmetrical without gross deformities. Normal posture. Neurologic:  Alert and  oriented x4;  grossly normal neurologically. Psych:  Alert and cooperative. Normal mood and affect.  Intake/Output from previous day: 07/01 0701 - 07/02 0700 In: 722.2 [P.O.:240; I.V.:482.2] Out: -  Intake/Output this shift: No intake/output data recorded.  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   Rectal bleeding/anemia: Hemoglobin 7.3 on admission in the setting of painless rectal bleeding x 2 weeks.  Brief episode of abdominal pain x 2 days last week, now resolved.  No anticoagulation.  She took Advil for 2 days last week, but otherwise avoids NSAIDs.  Etiology is unclear.  Symptoms do not seem consistent with ischemic colitis or diverticular etiology in light of continued bleeding.  Known prior hemorrhoids s/p banding in 2017/2018.  Interestingly, patient reported bleeding hemorrhoids were the cause of her profound anemia in 2006 while in [place].  Her last colonoscopy was in 2017 with nonbleeding internal hemorrhoids, diverticulosis, benign rectal polyp removed.    Had planned for colonoscopy today for further evaluation, but hemoglobin declined to 5.6 today.  Patient  refusing all blood products.  Prior anaphylactic reaction to IV iron while in [place].  Patient reporting today that she was able to tolerate a "special" IV iron with what ever she was allergic to taken out of it.  I am not sure what she is referring to.  Patient also mentioning EPO today to help boost her hemoglobin, but I explained that this would not help significantly in this acute setting.  Nonetheless, I will relay this information to Dr. Arbutus Leas to see if he has any additional recommendations.   At this point, we will have to  monitor her hemoglobin and I fit improves to 7, we can then proceed with a colonoscopy.  Encouragingly, her rectal bleeding seems to be tapering off.     Plan: Colonoscopy planned for today cancelled due to Hgb 5.6. If Hgb improves to 7, we can proceed with a colonoscopy.  Discussed with Dr. Arbutus Leas patient's concerns/recommendations regarding non blood product options to boost hemoglobin.  Continue to monitory H/H.  Monitor for overt GI bleeding.    LOS: 0 days    09/09/2022, 11:34 AM   Ermalinda Memos, PA-C [facility]

## 2022-09-09 NOTE — Progress Notes (Signed)
PT stated she refuses any and all blood products. Refusal in chart.

## 2022-09-10 DIAGNOSIS — Z9189 Other specified personal risk factors, not elsewhere classified: Secondary | ICD-10-CM

## 2022-09-10 DIAGNOSIS — E785 Hyperlipidemia, unspecified: Secondary | ICD-10-CM | POA: Diagnosis not present

## 2022-09-10 DIAGNOSIS — K922 Gastrointestinal hemorrhage, unspecified: Secondary | ICD-10-CM | POA: Diagnosis not present

## 2022-09-10 DIAGNOSIS — K625 Hemorrhage of anus and rectum: Secondary | ICD-10-CM | POA: Diagnosis not present

## 2022-09-10 DIAGNOSIS — D649 Anemia, unspecified: Secondary | ICD-10-CM | POA: Diagnosis not present

## 2022-09-10 DIAGNOSIS — F32A Depression, unspecified: Secondary | ICD-10-CM | POA: Diagnosis present

## 2022-09-10 LAB — CBC
HCT: 17.9 % — ABNORMAL LOW (ref 36.0–46.0)
Hemoglobin: 5.6 g/dL — CL (ref 12.0–15.0)
MCH: 27.7 pg (ref 26.0–34.0)
MCHC: 31.3 g/dL (ref 30.0–36.0)
MCV: 88.6 fL (ref 80.0–100.0)
Platelets: 289 10*3/uL (ref 150–400)
RBC: 2.02 MIL/uL — ABNORMAL LOW (ref 3.87–5.11)
RDW: 15.9 % — ABNORMAL HIGH (ref 11.5–15.5)
WBC: 7.5 10*3/uL (ref 4.0–10.5)
nRBC: 0.3 % — ABNORMAL HIGH (ref 0.0–0.2)

## 2022-09-10 MED ORDER — PANTOPRAZOLE SODIUM 40 MG PO TBEC
40.0000 mg | DELAYED_RELEASE_TABLET | Freq: Every day | ORAL | Status: DC
  Administered 2022-09-11: 40 mg via ORAL
  Filled 2022-09-10: qty 1

## 2022-09-10 MED ORDER — SODIUM CHLORIDE 0.9 % IV SOLN
250.0000 mg | INTRAVENOUS | Status: DC
  Administered 2022-09-10: 250 mg via INTRAVENOUS
  Filled 2022-09-10 (×2): qty 20

## 2022-09-10 MED ORDER — ENSURE ENLIVE PO LIQD
237.0000 mL | Freq: Two times a day (BID) | ORAL | Status: DC
  Administered 2022-09-10 – 2022-09-11 (×2): 237 mL via ORAL

## 2022-09-10 NOTE — Progress Notes (Signed)
Subjective: Patient has family present at bedside. She denies rectal bleeding or abdominal pain today. Last saw rectal bleeding around 1900 yesterday. Denies nausea or vomiting. She reports some SOB on exertion when she gets up to walk to the restroom.   Objective: Vital signs in last 24 hours: Temp:  [97.9 F (36.6 C)-99.9 F (37.7 C)] 98.2 F (36.8 C) (07/03 0349) Pulse Rate:  [86-97] 86 (07/03 0349) Resp:  [16-20] 17 (07/03 0349) BP: (111-133)/(64-75) 111/64 (07/03 0349) SpO2:  [97 %-100 %] 100 % (07/03 0349) Last BM Date : 09/08/22 General:   Alert and oriented, pleasant Eyes:  No icterus, sclera clear. Conjuctiva pink.  Mouth:  Without lesions, mucosa pink and moist.  Heart:  S1, S2 present, no murmurs noted.  Lungs: Clear to auscultation bilaterally, without wheezing, rales, or rhonchi.  Abdomen:  Bowel sounds present, soft, non-tender, non-distended. No HSM or hernias noted. No rebound or guarding. No masses appreciated  Msk:  Symmetrical without gross deformities. Normal posture. Pulses:  Normal pulses noted. Extremities:  Without clubbing or edema. Neurologic:  Alert and  oriented x4;  grossly normal neurologically. Skin:  Warm and dry, intact without significant lesions.  Psych:  Alert and cooperative. Normal mood and affect.  Intake/Output from previous day: 07/02 0701 - 07/03 0700 In: 480 [P.O.:480] Out: -  Intake/Output this shift: No intake/output data recorded.  Lab Results: Recent Labs    09/08/22 1036 09/09/22 0442 09/10/22 0429  WBC 7.5 7.8 7.5  HGB 7.3* 5.6* 5.6*  HCT 23.2* 17.7* 17.9*  PLT 273 241 289   BMET Recent Labs    09/08/22 1036 09/09/22 0442  NA 135 139  K 4.0 3.9  CL 104 109  CO2 24 23  GLUCOSE 222* 171*  BUN 9 7  CREATININE 0.74 0.67  CALCIUM 8.8* 8.1*   LFT Recent Labs    09/08/22 1036  PROT 6.2*  ALBUMIN 3.4*  AST 29  ALT 42  ALKPHOS 75  BILITOT 0.3   PT/INR Recent Labs    09/08/22 1226  LABPROT 15.0  INR  1.2    Studies/Results: CT ANGIO GI BLEED  Result Date: 09/09/2022 CLINICAL DATA:  hematochezia.  acute blood loss anemia EXAM: CTA ABDOMEN AND PELVIS WITHOUT AND WITH CONTRAST TECHNIQUE: Multidetector CT imaging of the abdomen and pelvis was performed using the standard protocol during bolus administration of intravenous contrast. Multiplanar reconstructed images and MIPs were obtained and reviewed to evaluate the vascular anatomy. RADIATION DOSE REDUCTION: This exam was performed according to the departmental dose-optimization program which includes automated exposure control, adjustment of the mA and/or kV according to patient size and/or use of iterative reconstruction technique. CONTRAST:  OMNIPAQUE IOHEXOL 350 MG/ML SOLN COMPARISON:  None Available. FINDINGS: VASCULAR Aorta: Normal caliber aorta without aneurysm, dissection, vasculitis or significant stenosis. Celiac: Patent without evidence of aneurysm, dissection, vasculitis or significant stenosis. SMA: Patent without evidence of aneurysm, dissection, vasculitis or significant stenosis. Renals: Both renal arteries are patent without evidence of aneurysm, dissection, vasculitis, fibromuscular dysplasia or significant stenosis. IMA: Patent without evidence of aneurysm, dissection, vasculitis or significant stenosis. Inflow: Patent without evidence of aneurysm, dissection, vasculitis or significant stenosis. Proximal Outflow: Bilateral common femoral and visualized portions of the superficial and profunda femoral arteries are patent without evidence of aneurysm, dissection, vasculitis or significant stenosis. Veins: No obvious venous abnormality within the limitations of this arterial phase study. Review of the MIP images confirms the above findings. NON-VASCULAR Lower chest: No acute abnormality Hepatobiliary: No focal  hepatic abnormality. Gallbladder unremarkable. Pancreas: No focal abnormality or ductal dilatation. Spleen: No focal abnormality.   Normal size. Adrenals/Urinary Tract: No suspicious renal or adrenal lesion. No stones or hydronephrosis. Urinary bladder unremarkable. Stomach/Bowel: Normal appendix. Stomach, large and small bowel grossly unremarkable. No contrast extravasation visualized to localize GI bleed. Lymphatic: No adenopathy Reproductive: Uterus and adnexa unremarkable.  No mass. Other: No free fluid or free air. Musculoskeletal: No acute bony abnormality. 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 IMPRESSION: VASCULAR No aneurysm, dissection, or significant stenosis. Scattered atherosclerotic calcifications in the distal aorta and common iliac arteries. No visible contrast extravasation within the bowel to localize GI bleed. NON-VASCULAR No acute findings in the abdomen or pelvis. Electronically Signed   By: Charlett Nose M.D.   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  Rectal bleeding/anemia:  Hgb 7.3 on admission, painless rectal bleeding x2 weeks. Previous abdominal pain x2 days last week, no resolved. No ACs. No frequnent NSAID use. Known prior hemorrhoids requiring banding in 2018. Patient reported profound anemia in 2006, caused by her bleeding hemorrhoids. Last TCS in 2017 with non bleeding internal hemorrhoids, diverticulosis, benign rectal polyp.   Colonoscopy was planned for yesterday however hemoglobin declined to 5.6.  Patient refusing all blood products therefore colonoscopy was canceled.  Previous anaphylactic reaction to IV iron while in [place].  Patient reports she was able to tolerate "special" IV iron with what ever byproduct she was allergic to taken out of it.  Patient also mentioned EPO yesterday that  she has had in the past to help with her hemoglobin.  Hospitalist was made aware and appears Aranesp and Ferrlecit IV iron was ordered for the patient.  Thorough discussion was had via Dr. Levon Hedger with the patient and her family regarding receiving IV blood products given her ongoing significant anemia, however they continue to refuse.  CT angio GI bleed was done yesterday with no acute findings in the abdomen or pelvis.  At current state hemoglobin remains 5.6 this morning, we will continue to monitor hemoglobin, if this improves to 7 or above we can proceed with colonoscopy.  There was mention of potential colonoscopy without anesthesia if hemoglobin did not improve, however, patient has declined to do this. She is hungry and would like to eat.   Plan: Consider colonoscopy if hgb improves >7 Continue to trend h&h IV iron/Aranesp per hospitalist Monitor overt GI bleeding Soft diet today, clear liquids at midnight, pending possible prep tmrw if hgb improved    LOS: 1 day    09/10/2022, 8:53 AM   L. Jeanmarie Hubert, MSN, APRN, AGNP-C Adult-Gerontology Nurse Practitioner [facility] at [facility]

## 2022-09-10 NOTE — Assessment & Plan Note (Signed)
Continue with statin therapy.  ?

## 2022-09-10 NOTE — Assessment & Plan Note (Signed)
Severe iron deficiency anemia.  Serum iron 9, TIBC 373, transferrin saturation 2 and ferritin of 4.   Patient had IV iron, ferric gluconate 500 mg total  And one dose of EPO.   Plan to follow up iron panel as outpatient.

## 2022-09-10 NOTE — Progress Notes (Signed)
Progress Note   Patient: Gloria Nelson ZOX:[id number] DOB: August 24, 1965 DOA: 09/08/2022     1 DOS: the patient was seen and examined on 09/10/2022   Brief hospital course: 57 year old female with a history of hypertension, impaired glucose tolerance, anxiety/depression, internal hemorrhoids presenting with dizziness and generalized weakness.  The patient began feeling dizzy when she woke up around 5 AM on 09/08/2022.  She denied any focal extremity weakness, headache, syncope.  She denies any chest pain, nausea, vomiting, diarrhea, abdominal pain.  She has had some dyspnea on exertion.  She denies any coughing or hemoptysis.  She states that she has been having hematochezia with each bowel movement for the past 2 weeks.  She went to urgent care on the morning of 09/08/2022.  At urgent care, the patient was noted to have orthostatic vital signs that were positive.  Her blood pressure dropped from 107/64 to 88/55 and heart rate went from 89 to 109 from lying to standing.  As result, the patient was directed to the emergency department for further evaluation and treatment. Notably, the patient had a colonoscopy on 01/30/2016 which revealed nonbleeding internal hemorrhoids, grade 2 with diverticulosis and a rectal polyp.  She followed up in the GI office on 05/06/2016 where she had her internal hemorrhoids banded by Dr. Jena Gauss.  She has not followed up since then.  Patient states that she took Advil for 2 days on 09/03/2022 and 09/04/2022.  She denies any melena, hematemesis.  In the ED, the patient was afebrile and hemodynamically stable with oxygen saturation 100% room air.  WBC 7.5, hemoglobin 7.3, platelets 273,000.  Sodium 135, potassium 4.0, bicarbonate 24, serum creatinine 0.74.  AST 21, ALT 42, alkaline phosphatase 75, total bilirubin 0.3.  EKG shows sinus rhythm with nonspecific T wave change.  FOBT was positive.  GI was consulted to assist with management.  Assessment and Plan: * Acute lower GI  bleeding Follow up hgb is 5,6  No recent bowel movements.  Systolic blood pressure 110 to 111 mmHg.   Patient continue to decline PRBC transfusion, I have explained her the severity of the anemia and high risk for end organ damage including death.   No invasive interventions for now due to severe anemia.  Advance diet as tolerated to soft.    Symptomatic anemia Severe iron deficiency anemia.  Serum iron 9, TIBC 373, transferrin saturation 2 and ferritin of 4.   Patient had IV iron, ferric gluconate 250 mg yesterday. And one dose of EPO.  Plan to add another 2 dose of IV iron   Follow up hgb in am.   Hyperlipidemia Continue with statin therapy.         Subjective: Patient feeling very weak and fatigued, no chest pain or dyspnea   Physical Exam: Vitals:   09/09/22 1243 09/09/22 2054 09/10/22 0349 09/10/22 1304  BP: 133/71 133/74 111/64 (!) 110/48  Pulse: 95 97 86 88  Resp: 18 20 17 18   Temp: 97.9 F (36.6 C) 99.9 F (37.7 C) 98.2 F (36.8 C) 98.8 F (37.1 C)  TempSrc:    Oral  SpO2: 100% 97% 100% 98%  Weight:      Height:       Neurology awake and alert ENT with mild pallor Cardiovascular with S1 and S2 present and rhythmic Respiratory with no rales or wheezing Abdomen with no distention  No lower extremity edema  Data Reviewed:    Family Communication: I spoke with patient's family at the bedside, we talked  in detail about patient's condition, plan of care and prognosis and all questions were addressed.   Disposition: Status is: Inpatient Remains inpatient appropriate because: pending resolution of bleeding   Planned Discharge Destination: Home     Author: Coralie Keens, MD 09/10/2022 1:26 PM  For on call review [email or web address].

## 2022-09-10 NOTE — Assessment & Plan Note (Addendum)
Patient with no further bloody stools. Her hgb has been stable at 5,6 for the last 3 days.   Patient continue to decline PRBC transfusion, I have explained her the severity of the anemia and high risk for end organ damage including death.   No invasive interventions for now due to severe anemia.   Patient will follow up as outpatient, possible further GI work up when anemia improves.

## 2022-09-11 DIAGNOSIS — K922 Gastrointestinal hemorrhage, unspecified: Secondary | ICD-10-CM | POA: Diagnosis not present

## 2022-09-11 DIAGNOSIS — D649 Anemia, unspecified: Secondary | ICD-10-CM | POA: Diagnosis not present

## 2022-09-11 DIAGNOSIS — E785 Hyperlipidemia, unspecified: Secondary | ICD-10-CM | POA: Diagnosis not present

## 2022-09-11 DIAGNOSIS — F32A Depression, unspecified: Secondary | ICD-10-CM

## 2022-09-11 LAB — HEMOGLOBIN AND HEMATOCRIT, BLOOD
HCT: 18.2 % — ABNORMAL LOW (ref 36.0–46.0)
Hemoglobin: 5.6 g/dL — CL (ref 12.0–15.0)

## 2022-09-11 MED ORDER — PANTOPRAZOLE SODIUM 40 MG PO TBEC: 40.0000 mg | DELAYED_RELEASE_TABLET | Freq: Every day | ORAL | 0 refills | Status: DC

## 2022-09-11 NOTE — Discharge Summary (Addendum)
Physician Discharge Summary   Patient: Gloria Nelson MRN: [id number] DOB: 1965-04-09  Admit date:     09/08/2022  Discharge date: 09/11/22  Discharge Physician: York Ram    PCP: Benita Stabile, MD   Recommendations at discharge:    Patient continue to decline PRBC transfusion. She has received IV iron and EPO during this hospitalization.  GI evaluation is delayed until when anemia improves.  Patient and her family understand that patient's hemoglobin is critically low with imminent risk for end organ damage, including death.  Follow up as outpatient with Dr. Margo Aye in 7 to 10 days.   Discharge Diagnoses: Principal Problem:   Acute lower GI bleeding Active Problems:   Symptomatic anemia   Hyperlipidemia  Resolved Problems:   * No resolved hospital problems. *  Hospital Course: 57 year old female with a history of hypertension, impaired glucose tolerance, anxiety/depression, internal hemorrhoids presenting with dizziness and generalized weakness.  The patient began feeling dizzy when she woke up around 5 AM on 09/08/2022.  She denied any focal extremity weakness, headache, syncope.  She denies any chest pain, nausea, vomiting, diarrhea, abdominal pain.  She has had some dyspnea on exertion.  She denies any coughing or hemoptysis.  She states that she has been having hematochezia with each bowel movement for the past 2 weeks.  She went to urgent care on the morning of 09/08/2022.  At urgent care, the patient was noted to have orthostatic vital signs that were positive.  Her blood pressure dropped from 107/64 to 88/55 and heart rate went from 89 to 109 from lying to standing.  As result, the patient was directed to the emergency department for further evaluation and treatment. Notably, the patient had a colonoscopy on 01/30/2016 which revealed nonbleeding internal hemorrhoids, grade 2 with diverticulosis and a rectal polyp.  She followed up in the GI office on 05/06/2016 where she had  her internal hemorrhoids banded by Dr. Jena Gauss.  She has not followed up since then.  Patient states that she took Advil for 2 days on 09/03/2022 and 09/04/2022.  She denies any melena, hematemesis.  In the ED, the patient was afebrile and hemodynamically stable with oxygen saturation 100% room air.  WBC 7.5, hemoglobin 7.3, platelets 273,000.  Sodium 135, potassium 4.0, bicarbonate 24, serum creatinine 0.74.  AST 21, ALT 42, alkaline phosphatase 75, total bilirubin 0.3.  EKG shows sinus rhythm with nonspecific T wave change.  FOBT was positive.  GI was consulted to assist with management.  Assessment and Plan: * Acute lower GI bleeding Patient with no further bloody stools. Her hgb has been stable at 5,6 for the last 3 days.   Patient continue to decline PRBC transfusion, I have explained her the severity of the anemia and high risk for end organ damage including death.   No invasive interventions for now due to severe anemia.   Patient will follow up as outpatient, possible further GI work up when anemia improves.   Symptomatic anemia Severe iron deficiency anemia.  Serum iron 9, TIBC 373, transferrin saturation 2 and ferritin of 4.   Patient had IV iron, ferric gluconate 500 mg total  And one dose of EPO.   Plan to follow up iron panel as outpatient.   Hyperlipidemia Continue with statin therapy.   Depression Continue with bupropion.         Consultants: GI  Procedures performed: none   Disposition: Home Diet recommendation:  Regular diet DISCHARGE MEDICATION: Allergies as of 09/11/2022  Reactions   Iron Anaphylaxis   IV iron in 2006. Unknown preparation.    Other    No blood products        Medication List     STOP taking these medications    FLUoxetine 20 MG capsule Commonly known as: PROZAC   VITAMIN E PO       TAKE these medications    acetaminophen 500 MG tablet Commonly known as: TYLENOL Take 500-1,000 mg by mouth every 8 (eight) hours as  needed for mild pain.   Allegra Allergy 180 MG tablet Generic drug: fexofenadine Take 180 mg by mouth daily as needed for allergies.   buPROPion 150 MG 12 hr tablet Commonly known as: WELLBUTRIN SR Take 150 mg by mouth daily.   CAL-MAG-ZINC PO Take 1 capsule by mouth daily.   multivitamin with minerals Tabs tablet Take 1 tablet by mouth daily.   pantoprazole 40 MG tablet Commonly known as: PROTONIX Take 1 tablet (40 mg total) by mouth daily. Start taking on: September 12, 2022   psyllium 58.6 % powder Commonly known as: METAMUCIL Take 1 packet by mouth daily.   rosuvastatin 5 MG tablet Commonly known as: CRESTOR Take 5 mg by mouth daily.        Discharge Exam: Filed Weights   09/08/22 1424  Weight: 77.6 kg   BP (!) 143/61 (BP Location: Left Arm)   Pulse (!) 105   Temp 98.2 F (36.8 C) (Oral)   Resp 18   Ht 4\' 11"  (1.499 m)   Wt 77.6 kg   LMP 08/28/2015 (Approximate)   SpO2 100%   BMI 34.55 kg/m   Patient with denies any chest pain or dyspnea at rest.  Neurology awake and alert ENT with positive pallor Cardiovascular with S1 and S2 present and rhythmic with no gallops, rubs or murmurs Respiratory with no rales or wheezing, no rhonchi Abdomen with no distention  No lower extremity edema   Condition at discharge: stable  The results of significant diagnostics from this hospitalization (including imaging, microbiology, ancillary and laboratory) are listed below for reference.   Imaging Studies: CT ANGIO GI BLEED  Result Date: 09/09/2022 CLINICAL DATA:  hematochezia.  acute blood loss anemia EXAM: CTA ABDOMEN AND PELVIS WITHOUT AND WITH CONTRAST TECHNIQUE: Multidetector CT imaging of the abdomen and pelvis was performed using the standard protocol during bolus administration of intravenous contrast. Multiplanar reconstructed images and MIPs were obtained and reviewed to evaluate the vascular anatomy. RADIATION DOSE REDUCTION: This exam was performed according to  the departmental dose-optimization program which includes automated exposure control, adjustment of the mA and/or kV according to patient size and/or use of iterative reconstruction technique. CONTRAST:  OMNIPAQUE IOHEXOL 350 MG/ML SOLN COMPARISON:  None Available. FINDINGS: VASCULAR Aorta: Normal caliber aorta without aneurysm, dissection, vasculitis or significant stenosis. Celiac: Patent without evidence of aneurysm, dissection, vasculitis or significant stenosis. SMA: Patent without evidence of aneurysm, dissection, vasculitis or significant stenosis. Renals: Both renal arteries are patent without evidence of aneurysm, dissection, vasculitis, fibromuscular dysplasia or significant stenosis. IMA: Patent without evidence of aneurysm, dissection, vasculitis or significant stenosis. Inflow: Patent without evidence of aneurysm, dissection, vasculitis or significant stenosis. Proximal Outflow: Bilateral common femoral and visualized portions of the superficial and profunda femoral arteries are patent without evidence of aneurysm, dissection, vasculitis or significant stenosis. Veins: No obvious venous abnormality within the limitations of this arterial phase study. Review of the MIP images confirms the above findings. NON-VASCULAR Lower chest: No acute abnormality Hepatobiliary:  No focal hepatic abnormality. Gallbladder unremarkable. Pancreas: No focal abnormality or ductal dilatation. Spleen: No focal abnormality.  Normal size. Adrenals/Urinary Tract: No suspicious renal or adrenal lesion. No stones or hydronephrosis. Urinary bladder unremarkable. Stomach/Bowel: Normal appendix. Stomach, large and small bowel grossly unremarkable. No contrast extravasation visualized to localize GI bleed. Lymphatic: No adenopathy Reproductive: Uterus and adnexa unremarkable.  No mass. Other: No free fluid or free air. Musculoskeletal: No acute bony abnormality. 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  IMPRESSION: VASCULAR No aneurysm, dissection, or significant stenosis. Scattered atherosclerotic calcifications in the distal aorta and common iliac arteries. No visible contrast extravasation within the bowel to localize GI bleed. NON-VASCULAR No acute findings in the abdomen or pelvis. Electronically Signed   By: Charlett Nose M.D.   On: 09/09/2022 20:09    Microbiology: No results found for this or any previous visit.  Labs: CBC: Recent Labs  Lab 09/08/22 1036 09/09/22 0442 09/10/22 0429 09/11/22 0447  WBC 7.5 7.8 7.5  --   NEUTROABS 5.3  --   --   --   HGB 7.3* 5.6* 5.6* 5.6*  HCT 23.2* 17.7* 17.9* 18.2*  MCV 88.2 88.9 88.6  --   PLT 273 241 289  --    Basic Metabolic Panel: Recent Labs  Lab 09/08/22 1036 09/09/22 0442  NA 135 139  K 4.0 3.9  CL 104 109  CO2 24 23  GLUCOSE 222* 171*  BUN 9 7  CREATININE 0.74 0.67  CALCIUM 8.8* 8.1*  MG  --  1.9   Liver Function Tests: Recent Labs  Lab 09/08/22 1036  AST 29  ALT 42  ALKPHOS 75  BILITOT 0.3  PROT 6.2*  ALBUMIN 3.4*   CBG: No results for input(s): "GLUCAP" in the last 168 hours.  Discharge time spent: greater than 30 minutes.  Signed: Coralie Keens, MD [facility] 09/11/2022

## 2022-09-11 NOTE — Assessment & Plan Note (Signed)
Continue with bupropion.  ?

## 2022-09-18 ENCOUNTER — Telehealth: Payer: Self-pay | Admitting: Gastroenterology

## 2022-09-18 DIAGNOSIS — D649 Anemia, unspecified: Secondary | ICD-10-CM | POA: Diagnosis not present

## 2022-09-18 DIAGNOSIS — D509 Iron deficiency anemia, unspecified: Secondary | ICD-10-CM | POA: Diagnosis not present

## 2022-09-18 DIAGNOSIS — Z713 Dietary counseling and surveillance: Secondary | ICD-10-CM | POA: Diagnosis not present

## 2022-09-18 DIAGNOSIS — K922 Gastrointestinal hemorrhage, unspecified: Secondary | ICD-10-CM | POA: Diagnosis not present

## 2022-09-18 DIAGNOSIS — Z6833 Body mass index (BMI) 33.0-33.9, adult: Secondary | ICD-10-CM | POA: Diagnosis not present

## 2022-09-18 NOTE — Telephone Encounter (Signed)
Good afternoon Dr Adela Lank  We have received a request from patient wanting to transfer care over to you. Patient is wanting to have a procedure.  Records are available for review in epic. Please review and advise on scheduling.   Thank you

## 2022-09-19 NOTE — Telephone Encounter (Signed)
Chart reviewed. Can you help clarify what procedure she wants /needs - I am not sure if she is referring to therapy for hemorrhoids or another endoscopic procedure.  I see she was recently in the hospital and had a Hgb of 5s and declined a blood transfusion against medical advice. I am happy to see her in the office to establish as a new patient but do not have an available appointment for a new patient for a few months. If she wants to establish with me she can do so but I can't see her quickly or soon unless something opens up, can add her to wait list. She will need to see her established GI physician, Dr. Levon Hedger in the interim for hospital follow up and acute care for her recent issues until her appointment with me

## 2022-09-23 NOTE — Telephone Encounter (Signed)
Called patient. Was unable to leave voicemail. 2nd attempt.

## 2022-09-25 ENCOUNTER — Encounter: Payer: Self-pay | Admitting: Gastroenterology

## 2022-09-25 DIAGNOSIS — E669 Obesity, unspecified: Secondary | ICD-10-CM | POA: Diagnosis not present

## 2022-09-25 DIAGNOSIS — Z6832 Body mass index (BMI) 32.0-32.9, adult: Secondary | ICD-10-CM | POA: Diagnosis not present

## 2022-09-25 DIAGNOSIS — M1990 Unspecified osteoarthritis, unspecified site: Secondary | ICD-10-CM | POA: Diagnosis not present

## 2022-09-25 DIAGNOSIS — Z833 Family history of diabetes mellitus: Secondary | ICD-10-CM | POA: Diagnosis not present

## 2022-09-25 DIAGNOSIS — Z8249 Family history of ischemic heart disease and other diseases of the circulatory system: Secondary | ICD-10-CM | POA: Diagnosis not present

## 2022-09-25 DIAGNOSIS — Z809 Family history of malignant neoplasm, unspecified: Secondary | ICD-10-CM | POA: Diagnosis not present

## 2022-09-25 DIAGNOSIS — R030 Elevated blood-pressure reading, without diagnosis of hypertension: Secondary | ICD-10-CM | POA: Diagnosis not present

## 2022-09-25 DIAGNOSIS — E785 Hyperlipidemia, unspecified: Secondary | ICD-10-CM | POA: Diagnosis not present

## 2022-09-25 NOTE — Telephone Encounter (Signed)
Good Morning Dr Adela Lank  Just an update, patient is scheduled for ov on 10/1.  Patient states she is wanting to have a colon procedure. But she is having complications with hemorrhoids and rectal bleeding.

## 2022-09-25 NOTE — Telephone Encounter (Signed)
Okay, I am happy to see her but I don't have any openings until her scheduled appointment. I will need to see her in the office first prior to any interventions that we will do for her. In the interim, until October 1, if she needs more acute / urgent care for this issue she will need to follow up with Dr. Levon Hedger her current GI.

## 2022-09-26 ENCOUNTER — Ambulatory Visit: Payer: 59 | Admitting: Gastroenterology

## 2022-10-02 ENCOUNTER — Other Ambulatory Visit (INDEPENDENT_AMBULATORY_CARE_PROVIDER_SITE_OTHER): Payer: 59

## 2022-10-02 ENCOUNTER — Encounter: Payer: Self-pay | Admitting: Gastroenterology

## 2022-10-02 ENCOUNTER — Ambulatory Visit: Payer: 59 | Admitting: Gastroenterology

## 2022-10-02 VITALS — BP 130/58 | HR 72 | Ht 59.0 in | Wt 171.0 lb

## 2022-10-02 DIAGNOSIS — Z531 Procedure and treatment not carried out because of patient's decision for reasons of belief and group pressure: Secondary | ICD-10-CM | POA: Diagnosis not present

## 2022-10-02 DIAGNOSIS — K649 Unspecified hemorrhoids: Secondary | ICD-10-CM

## 2022-10-02 DIAGNOSIS — D509 Iron deficiency anemia, unspecified: Secondary | ICD-10-CM

## 2022-10-02 DIAGNOSIS — K625 Hemorrhage of anus and rectum: Secondary | ICD-10-CM

## 2022-10-02 LAB — HEMOGLOBIN: Hemoglobin: 9.2 g/dL — ABNORMAL LOW (ref 12.0–15.0)

## 2022-10-02 MED ORDER — HYDROCORTISONE 1 % EX OINT: 1.0000 | TOPICAL_OINTMENT | Freq: Two times a day (BID) | CUTANEOUS | Status: DC

## 2022-10-02 MED ORDER — CALMOL-4 76-10 % RE SUPP: RECTAL | 0 refills | Status: AC

## 2022-10-02 NOTE — Patient Instructions (Addendum)
Please go to the lab in the basement of our building to have lab work done as you leave today. Hit "B" for basement when you get on the elevator.  When the doors open the lab is on your left.  We will call you with the results. Thank you.  We have given you samples of the following medication to take: Calmol 4 suppositories: Apply a pea-sized amount of 1% hydrocortisone cream onto the suppository before insertion TWICE DAILY  Take Citrucel- take once daily.  Thank you for entrusting me with your care and for choosing [facility] Dr. Ileene Patrick    If your blood pressure at your visit was 140/90 or greater, please contact your primary care physician to follow up on this. ______________________________________________________  If you are age 59 or older, your body mass index should be between 23-30. Your Body mass index is 34.54 kg/m. If this is out of the aforementioned range listed, please consider follow up with your Primary Care Provider.  If you are age 51 or younger, your body mass index should be between 19-25. Your Body mass index is 34.54 kg/m. If this is out of the aformentioned range listed, please consider follow up with your Primary Care Provider.  ________________________________________________________  The [facility] providers would like to encourage you to use [facility] to communicate with providers for non-urgent requests or questions.  Due to long hold times on the telephone, sending your provider a message by [facility] may be a faster and more efficient way to get a response.  Please allow 48 business hours for a response.  Please remember that this is for non-urgent requests.  _______________________________________________________  Due to recent changes in healthcare laws, you may see the results of your imaging and laboratory studies on MyChart before your provider has had a chance to review them.  We understand that in some cases there may be results that are  confusing or concerning to you. Not all laboratory results come back in the same time frame and the provider may be waiting for multiple results in order to interpret others.  Please give [place] 48 hours in order for your provider to thoroughly review all the results before contacting the office for clarification of your results.

## 2022-10-02 NOTE — Progress Notes (Signed)
HPI :  57 year old female with a history of iron deficiency anemia, internal hemorrhoids, hypertension, here to establish care for rectal bleeding, recurrent anemia.  This is her first time to our practice, she is seeking a second opinion as she has previously been evaluated by [facility].  She reports back in 2006 she had rectal bleeding with severe anemia as well as hemoglobin for.  She states she had an endoscopic evaluation with EGD and colonoscopy at that time which revealed a diagnosis of internal hemorrhoids.  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  She was treated with iron supplementation.  She had a colonoscopy in 2017 which showed no precancerous polyps and moderate internal hemorrhoids.  She had hemorrhoid banding at that time although she states she really was not having significant symptoms then that bother her from her hemorrhoids.  Her more acute issue started about 6 weeks ago.  She states she has developed bright red blood with mostly every bowel movement.  She typically has a bowel movement anywhere from 1-3 times per day.  Denies any straining or prolonged sitting on the toilet with her bowels.  She denies any melena.  She is not on any blood thinners.  She denies any routine use of NSAIDs.  She has been having some intermittent rectal pain and burning in her rectum with bowel movements.  She has been using baby way to LidoCream cream which have helped with this.  She was admitted to [facility] on July 1 for rectal bleeding and severe anemia.  Her hemoglobin was 7.3 and drifted down to low 5.6.  GI service at [facility] was consulted and recommended EGD and colonoscopy if her hemoglobin was high enough.  Anesthesia would not proceed with her case until her hemoglobin was greater than 7.  She had a CTA done during the hospitalization which was normal. The patient has a reported anaphylactic reaction to IV iron remotely several years ago, however she  received 2 doses of IV iron while hospitalized and she tolerated it.  She has been taking oral iron since her discharge.  She declines blood transfusion in any circumstance understanding high risk for MI and CVA and death with severe anemia.  She was discharged from the hospital after there were no plans for further evaluation or treatment.  She was not offered hemorrhoid banding for treatment at the time, although hemorrhoids were the suspected cause of her symptoms.  She states that the IV iron infusion has helped her feel better.  She denies any fatigue.  She denies any exertional shortness of breath or chest pain but has not really been exerting herself in any way.  She denies any symptoms at rest.  She continues to have rectal bleeding with every bowel movement.  She denies any hard stools or straining.  She occasionally has some burning in her rectum but that seems to be a bit better now.  Essentially her bleeding has not really changed at all since her discharge from the hospital.  She is eating okay, no nausea or vomiting.  She has been taking an oral iron supplement daily since her discharge.  I again discussed the situation with her and she again will not accept blood transfusion under any circumstances.  She has not had labs since her discharge from the hospital.  Her last hemoglobin was 5.6 on July 4.  She is not using anything to treat her hemorrhoids.  She has lidocaine cream she is  using for occasional burning in her perianal area.  Colonoscopy Nov 2017: non-bleeding internal hemorrhoids, diverticulosis, rectal polyp. (Benign lymphoid polyp).    No FH colon cancer or polyps.    CTA 09/09/2022: MPRESSION: VASCULAR No aneurysm, dissection, or significant stenosis. Scattered atherosclerotic calcifications in the distal aorta and common iliac arteries. No visible contrast extravasation within the bowel to localize GI bleed. NON-VASCULAR No acute findings in the abdomen or  pelvis.   Past Medical History:  Diagnosis Date   Anxiety    Carpal tunnel syndrome, bilateral    Colon polyps    Depression with anxiety    History of anemia    Hyperlipidemia    Hypertension    IDA (iron deficiency anemia)    Left sided sciatica    Obesity    Paresthesias    Prediabetes      Past Surgical History:  Procedure Laterality Date   COLONOSCOPY  06/22/2004   Images only (marked for scanning), no report.   COLONOSCOPY N/A 01/30/2016   Procedure: COLONOSCOPY;  Surgeon: Corbin Ade, MD;  Location: [facility];  Service: Endoscopy;  Laterality: N/A;  830   ENDOMETRIAL ABLATION  2006   ovarian cyst removed   Family History  Problem Relation Age of Onset   Hypertension Mother    Obesity Mother    Diabetes Father    Kidney disease Father    Blindness Father    Hypertension Sister    Hyperlipidemia Sister    Ovarian cancer Sister    Diabetes Brother    Colon polyps Maternal Uncle    Anxiety disorder Daughter    Anxiety disorder Son    Colon cancer Neg Hx    Social History   Tobacco Use   Smoking status: Never   Smokeless tobacco: Never  Vaping Use   Vaping status: Never Used  Substance Use Topics   Alcohol use: No   Drug use: No   Current Outpatient Medications  Medication Sig Dispense Refill   acetaminophen (TYLENOL) 500 MG tablet Take 500-1,000 mg by mouth every 8 (eight) hours as needed for mild pain.      Cyanocobalamin (B-12) 5000 MCG CAPS Take 1 capsule by mouth daily.     ferrous sulfate 325 (65 FE) MG EC tablet Take 325 mg by mouth daily with breakfast.     folic acid (FOLVITE) 400 MCG tablet Take 800 mcg by mouth daily.     Menaquinone-7 (VITAMIN K2) 100 MCG CAPS Take 100 mcg by mouth daily.     psyllium (METAMUCIL) 58.6 % powder Take 1 packet by mouth daily.     rosuvastatin (CRESTOR) 5 MG tablet Take 5 mg by mouth daily.     buPROPion (WELLBUTRIN SR) 150 MG 12 hr tablet Take 150 mg by mouth daily. (Patient not taking: Reported on  10/02/2022)  4   Calcium-Magnesium-Zinc (CAL-MAG-ZINC PO) Take 1 capsule by mouth daily.  (Patient not taking: Reported on 10/02/2022)     fexofenadine (ALLEGRA ALLERGY) 180 MG tablet Take 180 mg by mouth daily as needed for allergies.  (Patient not taking: Reported on 10/02/2022)     Multiple Vitamin (MULTIVITAMIN WITH MINERALS) TABS tablet Take 1 tablet by mouth daily. (Patient not taking: Reported on 10/02/2022)     No current facility-administered medications for this visit.   Allergies  Allergen Reactions   Other     No blood products     Review of Systems: All systems reviewed and negative except where noted in HPI.  CT ANGIO GI BLEED  Result Date: 09/09/2022 CLINICAL DATA:  hematochezia.  acute blood loss anemia EXAM: CTA ABDOMEN AND PELVIS WITHOUT AND WITH CONTRAST TECHNIQUE: Multidetector CT imaging of the abdomen and pelvis was performed using the standard protocol during bolus administration of intravenous contrast. Multiplanar reconstructed images and MIPs were obtained and reviewed to evaluate the vascular anatomy. RADIATION DOSE REDUCTION: This exam was performed according to the departmental dose-optimization program which includes automated exposure control, adjustment of the mA and/or kV according to patient size and/or use of iterative reconstruction technique. CONTRAST:  OMNIPAQUE IOHEXOL 350 MG/ML SOLN COMPARISON:  None Available. FINDINGS: VASCULAR Aorta: Normal caliber aorta without aneurysm, dissection, vasculitis or significant stenosis. Celiac: Patent without evidence of aneurysm, dissection, vasculitis or significant stenosis. SMA: Patent without evidence of aneurysm, dissection, vasculitis or significant stenosis. Renals: Both renal arteries are patent without evidence of aneurysm, dissection, vasculitis, fibromuscular dysplasia or significant stenosis. IMA: Patent without evidence of aneurysm, dissection, vasculitis or significant stenosis. Inflow: Patent without  evidence of aneurysm, dissection, vasculitis or significant stenosis. Proximal Outflow: Bilateral common femoral and visualized portions of the superficial and profunda femoral arteries are patent without evidence of aneurysm, dissection, vasculitis or significant stenosis. Veins: No obvious venous abnormality within the limitations of this arterial phase study. Review of the MIP images confirms the above findings. NON-VASCULAR Lower chest: No acute abnormality Hepatobiliary: No focal hepatic abnormality. Gallbladder unremarkable. Pancreas: No focal abnormality or ductal dilatation. Spleen: No focal abnormality.  Normal size. Adrenals/Urinary Tract: No suspicious renal or adrenal lesion. No stones or hydronephrosis. Urinary bladder unremarkable. Stomach/Bowel: Normal appendix. Stomach, large and small bowel grossly unremarkable. No contrast extravasation visualized to localize GI bleed. Lymphatic: No adenopathy Reproductive: Uterus and adnexa unremarkable.  No mass. Other: No free fluid or free air. Musculoskeletal: No acute bony abnormality. 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 IMPRESSION: VASCULAR No aneurysm, dissection, or significant stenosis. Scattered atherosclerotic calcifications in the distal aorta and common iliac arteries. No visible contrast extravasation within the bowel to localize GI bleed. NON-VASCULAR No acute findings in the abdomen or pelvis. Electronically Signed   By: Charlett Nose M.D.   On: 09/09/2022 20:09     Lab Results  Component Value Date   WBC 7.5 09/10/2022   HGB 5.6 (LL) 09/11/2022   HCT 18.2 (L) 09/11/2022   MCV 88.6 09/10/2022   PLT 289 09/10/2022       Latest Ref Rng & Units 09/11/2022    4:47 AM 09/10/2022    4:29 AM 09/09/2022    4:42 AM  CBC  WBC 4.0 - 10.5 K/uL  7.5  7.8   Hemoglobin 12.0 - 15.0 g/dL 5.6  5.6  5.6   Hematocrit 36.0 - 46.0 % 18.2  17.9  17.7   Platelets 150 - 400 K/uL  289  241     Lab Results  Component Value  Date   IRON 9 (L) 09/08/2022   TIBC 373 09/08/2022   FERRITIN 4 (L) 09/08/2022     Physical Exam: BP (!) 130/58 (BP Location: Left Arm, Patient Position: Sitting, Cuff Size: Large)   Pulse 72   Ht 4\' 11"  (1.499 m)   Wt 171 lb (77.6 kg)   LMP 08/28/2015 (Approximate)   BMI 34.54 kg/m  Constitutional: Pleasant,well-developed, female in no acute distress. HEENT: Normocephalic and atraumatic. Conjunctivae are normal. No scleral icterus. Neck supple.  Cardiovascular: Normal rate, regular rhythm.  Pulmonary/chest: Effort normal and breath sounds normal.  Abdominal: Soft, nondistended, nontender. There are no masses palpable. No hepatomegaly. DRE / Anoscopy - Christ Kick CMA standby - no fissure, inflamed hemorrhoids with fresh blood noted, culprit lesion for bleeding appears to be in the RP area, largest in RP area Extremities: no edema Neurological: Alert and oriented to person place and time. Skin: Skin is warm and dry. No rashes noted. Psychiatric: Normal mood and affect. Behavior is normal.   ASSESSMENT: 57 y.o. female here for assessment of the following  1. Rectal bleeding   2. Hemorrhoids, unspecified hemorrhoid type   3. Iron deficiency anemia, unspecified iron deficiency anemia type   4. Blood transfusion declined because patient is Jehovah's Witness    Given her symptoms and endoscopy findings and findings on her last colonoscopy, I suspect she is very likely having hemorrhoidal bleeding causing severe iron deficiency anemia.  Under normal circumstances would recommend blood transfusion for the severity of anemia, however given her status as Jehovah's Witness she declined blood transfusion under any circumstance understanding this level anemia could lead to MI, hemodynamic compromise, death etc.  As above, when admitted to the hospital an EGD and colonoscopy were recommended to her however hemoglobin was not high enough to tolerate anesthesia thus these procedures were  never done.  She was given IV iron which she tolerated and discharged home.  She continues to have bleeding on a daily basis which is essentially unchanged from the time of her hospitalization.  She was not given anything to treat suspected hemorrhoids.  She looks surprisingly well today and she states her coloration is better, she thinks her hemoglobin subjectively is higher.  She has been tolerating oral iron.  I need to check her hemoglobin again today to see where this is trending.  Ideally if her hemoglobin is greater than 7, I would proceed with colonoscopy to ensure no other cause of bleeding although again suspect this is very likely hemorrhoidal and yield of EGD is probably quite low.  It may be difficult to get her hemoglobin greater than 7 with frequent low-grade bleeding.  I anticipate she will need additional dosing of IV iron and can coordinate that if needed.  Will wait for hemoglobin today.  I will start her on Citrucel once daily to keep her stools soft and minimize time on the toilet or straining.  Will also recommend Calmol 4 suppositories and apply 1% hydrocortisone cream on top of the suppository and use twice daily to see if we can decrease the inflammation and sensitivity of hemorrhoids.  If her hemoglobin is improved we will consider colonoscopy based on where that is trending.  The question is, if it is not, what our treatment options are to treat hemorrhoids.  With this level of anemia she would not be a surgical candidate.  I think we could try doing hemorrhoid banding, initially to the RP area to treat the culprit lesion and see if that would help stop or reduce the bleeding.  The risk of banding includes risk for post banding ulcer rebleeding within 2 weeks of the procedure, risk for significant bleeding is less than 1: 1000, however her ability to tolerate this if she had significant post banding bleeding would be low given her level of anemia.  Again this is a very difficult  situation as she is refusing blood products.  She ultimately needs an intervention to stop the bleeding in order to get her hemoglobin to a safer level.  She may need to have hemorrhoid banding done understanding  risks of this, and lack of other options if her hemoglobin remains less than 7 for anesthesia related needs (either surgery or colonoscopy) if she is refusing blood transfusion.  Lengthy discussion with the patient via interpreter, she understands the situation and agrees with the plan.  PLAN: - Hgb today - start Citrucel once daily - start Calmol4 suppositories BID - can get more OTC - apply pea sized amount of 1% hydrocortisone cream OTC on the suppository - likely going to need more IV iron - will await Hgb result - ideally would do colonoscopy +/- EGD for this first to exclude other etiologies, but need her Hgb > 7 for that - may need hemorrhoid banding but risks for bleeding with that, ideally like Hgb higher if possible prior to doing this - she is refusing blood transfusion in all circumstances as outlined.  We will await lab and touch base with her in few days about next steps.  Harlin Rain, MD [facility]  CC: Benita Stabile, MD

## 2022-10-07 ENCOUNTER — Other Ambulatory Visit: Payer: Self-pay

## 2022-10-07 DIAGNOSIS — D509 Iron deficiency anemia, unspecified: Secondary | ICD-10-CM

## 2022-10-07 DIAGNOSIS — K649 Unspecified hemorrhoids: Secondary | ICD-10-CM

## 2022-10-07 DIAGNOSIS — K625 Hemorrhage of anus and rectum: Secondary | ICD-10-CM

## 2022-10-07 DIAGNOSIS — Z531 Procedure and treatment not carried out because of patient's decision for reasons of belief and group pressure: Secondary | ICD-10-CM

## 2022-10-07 MED ORDER — NA SULFATE-K SULFATE-MG SULF 17.5-3.13-1.6 GM/177ML PO SOLN: 1.0000 | Freq: Once | ORAL | 0 refills | Status: AC

## 2022-10-15 ENCOUNTER — Encounter: Payer: Self-pay | Admitting: Gastroenterology

## 2022-10-16 DIAGNOSIS — K922 Gastrointestinal hemorrhage, unspecified: Secondary | ICD-10-CM | POA: Diagnosis not present

## 2022-10-24 ENCOUNTER — Ambulatory Visit (AMBULATORY_SURGERY_CENTER): Payer: 59 | Admitting: Gastroenterology

## 2022-10-24 ENCOUNTER — Encounter: Payer: Self-pay | Admitting: Gastroenterology

## 2022-10-24 VITALS — BP 144/80 | HR 64 | Temp 98.4°F | Resp 14 | Ht 59.0 in | Wt 171.0 lb

## 2022-10-24 DIAGNOSIS — C20 Malignant neoplasm of rectum: Secondary | ICD-10-CM

## 2022-10-24 DIAGNOSIS — K625 Hemorrhage of anus and rectum: Secondary | ICD-10-CM | POA: Diagnosis not present

## 2022-10-24 DIAGNOSIS — D3A8 Other benign neuroendocrine tumors: Secondary | ICD-10-CM | POA: Diagnosis not present

## 2022-10-24 MED ORDER — SODIUM CHLORIDE 0.9 % IV SOLN
500.0000 mL | Freq: Once | INTRAVENOUS | Status: DC
Start: 2022-10-24 — End: 2022-10-24

## 2022-10-24 NOTE — Progress Notes (Signed)
Called to room to assist during endoscopic procedure.  Patient ID and intended procedure confirmed with present staff. Received instructions for my participation in the procedure from the performing physician.  

## 2022-10-24 NOTE — Op Note (Signed)
[facility] Patient Name: Gloria Nelson Procedure Date: 10/24/2022 2:27 PM MRN: [id number] Endoscopist: Viviann Spare P. Adela Lank , MD, [id number] Age: 57 Referring MD:  Date of Birth: 1965/07/22 Gender: Female Account #: [id number] Procedure:                Colonoscopy Indications:              Rectal bleeding - suspected hemorrhoidal, severe                            IDA - previously admitted to [facility] with Hgb of                            5, not able to have colonoscopy there due to                            severity of anemia. 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 She has responded to                            IV Iron, Hgb in the 9s, and resolution of rectal                            bleeding 2 weeks ago with topical therapy for                            hemorrhoids. Medicines:                Monitored Anesthesia Care Procedure:                Pre-Anesthesia Assessment:                           - Prior to the procedure, a History and Physical                            was performed, and patient medications and                            allergies were reviewed. The patient's tolerance of                            previous anesthesia was also reviewed. The risks                            and benefits of the procedure and the sedation                            options and risks were discussed with the patient.                            All questions were answered, and informed consent  was obtained. Prior Anticoagulants: The patient has                            taken no anticoagulant or antiplatelet agents. ASA                            Grade Assessment: II - A patient with mild systemic                            disease. After reviewing the risks and benefits,                            the patient was deemed in satisfactory condition to                            undergo the  procedure.                           After obtaining informed consent, the colonoscope                            was passed under direct vision. Throughout the                            procedure, the patient's blood pressure, pulse, and                            oxygen saturations were monitored continuously. The                            [id number]-HQ190L Colonoscope [id number] was introduced                            through the anus and advanced to the the terminal                            ileum, with identification of the appendiceal                            orifice and IC valve. The colonoscopy was performed                            without difficulty. The patient tolerated the                            procedure well. The quality of the bowel                            preparation was good. The terminal ileum, ileocecal                            valve, appendiceal orifice, and rectum were  photographed. Scope In: 2:41:55 PM Scope Out: 3:08:38 PM Scope Withdrawal Time: 0 hours 20 minutes 10 seconds  Total Procedure Duration: 0 hours 26 minutes 43 seconds  Findings:                 The perianal and digital rectal examinations were                            normal.                           The terminal ileum appeared normal.                           A 5 to 6 mm polyp was found in the rectum. The                            polyp had a yellowish hue and subepithelial, hard                            to palpation with snare, concerning for carcinoid.                            The polyp was removed with a hot snare. Resection                            and retrieval were complete. To prevent bleeding                            after the polypectomy in light of her status as                            Jehovah's witness, two hemostatic clips were                            successfully placed across the polypectomy defect.                            Area  just distal to the lesion was tattooed with an                            injection of Spot (carbon black).                           A few small-mouthed diverticula were found in the                            sigmoid colon.                           Internal hemorrhoids were found during                            retroflexion. The hemorrhoids were large and  inflamed. Some inflammed tissue noted at the base                            of one hemorrhoid, I suspect benign prolapse                            change, not biopsied due to bleeding risk given                            location along a hemorrhoid.                           The exam was otherwise without abnormality. Complications:            No immediate complications. Estimated blood loss:                            Minimal. Estimated Blood Loss:     Estimated blood loss was minimal. Impression:               - The examined portion of the ileum was normal.                           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 Removed with a                            hot snare. Resected and retrieved. Clips were                            placed prophylactically to minimize risk of                            bleeding. Tattooed.                           - Diverticulosis in the sigmoid colon.                           - Internal hemorrhoids.                           - The examination was otherwise normal.                           Hemorrhoids are large and inflamed and the cause of                            her bleeding / anemia. Recommendation:           - Patient has a contact number available for                            emergencies. The signs and symptoms  of potential                            delayed complications were discussed with the                            patient. Return to normal activities tomorrow.                             Written discharge instructions were provided to the                            patient.                           - Resume previous diet.                           - Continue present medications.                           - Avoid NSAIDs                           - Await pathology results.                           - Consideration for hemorrhoid banding in the                            office vs. surgical evaluation for                            hemorrhoidectomy, will discuss with patient                           - Continue topical therapy for hemorrhoids -                            hydrocortisone cream / Calmol4 suppositories, fiber                            supplement Viviann Spare P. Selina Tapper, MD 10/24/2022 3:22:33 PM This report has been signed electronically.

## 2022-10-24 NOTE — Progress Notes (Signed)
History and Physical Interval Note: seen on 10/02/22 - no interval changes. She is stable. Hgb improved with IV Iron, 9.2 up from 5.6 previously. She is scheduled for colonoscopy to evaluate rectal bleeding/ anemia. Since her office visit she has been using topical therapies, no further rectal bleeding for the past 2 weeks and she is feeling much better. I have discussed risks / benefits of colonoscopy and anesthesia with her and she wishes to proceed.    10/24/2022 2:34 PM  Blake D Buzan  has presented today for endoscopic procedure(s), with the diagnosis of  Encounter Diagnosis  Name Primary?   Rectal bleeding Yes  .  The various methods of evaluation and treatment have been discussed with the patient and/or family. After consideration of risks, benefits and other options for treatment, the patient has consented to  the endoscopic procedure(s).   The patient's history has been reviewed, patient examined, no change in status, stable for surgery.  I have reviewed the patient's chart and labs.  Questions were answered to the patient's satisfaction.    Harlin Rain, MD [facility]

## 2022-10-24 NOTE — Progress Notes (Signed)
Uneventful anesthetic. Report to pacu rn. Vss. Care resumed by rn. 

## 2022-10-24 NOTE — Patient Instructions (Addendum)
Handouts were given on polyps and hemorrhoids.   YOU HAD AN ENDOSCOPIC PROCEDURE TODAY AT THE [facility]:   Refer to the procedure report that was given to you for any specific questions about what was found during the examination.  If the procedure report does not answer your questions, please call your gastroenterologist to clarify.  If you requested that your care partner not be given the details of your procedure findings, then the procedure report has been included in a sealed envelope for you to review at your convenience later.  YOU SHOULD EXPECT: Some feelings of bloating in the abdomen. Passage of more gas than usual.  Walking can help get rid of the air that was put into your GI tract during the procedure and reduce the bloating. If you had a lower endoscopy (such as a colonoscopy or flexible sigmoidoscopy) you may notice spotting of blood in your stool or on the toilet paper. If you underwent a bowel prep for your procedure, you may not have a normal bowel movement for a few days.  Please Note:  You might notice some irritation and congestion in your nose or some drainage.  This is from the oxygen used during your procedure.  There is no need for concern and it should clear up in a day or so.  SYMPTOMS TO REPORT IMMEDIATELY:  Following lower endoscopy (colonoscopy or flexible sigmoidoscopy):  Excessive amounts of blood in the stool  Significant tenderness or worsening of abdominal pains  Swelling of the abdomen that is new, acute  Fever of 100F or higher   For urgent or emergent issues, a gastroenterologist can be reached at any hour by calling [phone]. Do not use MyChart messaging for urgent concerns.    DIET:  We do recommend a small meal at first, but then you may proceed to your regular diet.  Drink plenty of fluids but you should avoid alcoholic beverages for 24 hours.  ACTIVITY:  You should plan to take it easy for the rest of today and you should NOT  DRIVE or use heavy machinery until tomorrow (because of the sedation medicines used during the test).    FOLLOW UP: Our staff will call the number listed on your records the next business day following your procedure.  We will call around 7:15- 8:00 am to check on you and address any questions or concerns that you may have regarding the information given to you following your procedure. If we do not reach you, we will leave a message.     If any biopsies were taken you will be contacted by phone or by letter within the next 1-3 weeks.  Please call us at [phone] if you have not heard about the biopsies in 3 weeks.    SIGNATURES/CONFIDENTIALITY: You and/or your care partner have signed paperwork which will be entered into your electronic medical record.  These signatures attest to the fact that that the information above on your After Visit Summary has been reviewed and is understood.  Full responsibility of the confidentiality of this discharge information lies with you and/or your care-partner.USTED TUVO UN PROCEDIMIENTO ENDOSCPICO HOY EN EL [facility]:   Lea el informe del procedimiento que se le entreg para cualquier pregunta especfica sobre lo que se Dent[facility].  Si el informe del examen no responde a sus preguntas, por favor llame a su gastroenterlogo para aclararlo.  Si usted solicit que no se le den [employer] de lo que  se encontr en su procedimiento al acompaante que le va a cuidar, entonces el informe del procedimiento se ha incluido en un sobre sellado para que usted lo revise despus cuando le sea ms conveniente.   LO QUE PUEDE ESPERAR: Algunas sensaciones de hinchazn en el abdomen.  Puede tener ms gases de lo normal.  El caminar puede ayudarle a eliminar el aire que se le puso en el tracto gastrointestinal durante el procedimiento y reducir la hinchazn.  Si le hicieron una endoscopia inferior (como una colonoscopia o una sigmoidoscopia flexible),  podra notar manchas de sangre en las heces fecales o en el papel higinico.  Si se someti a una preparacin intestinal para su procedimiento, es posible que no tenga una evacuacin intestinal normal durante [employer].   Tenga en cuenta:  Es posible que note un poco de irritacin y congestin en la nariz o algn drenaje.  Esto es debido al oxgeno [employer] durante su procedimiento.  No hay que preocuparse y esto debe desaparecer ms o Regulatory affairs officer.   SNTOMAS PARA REPORTAR INMEDIATAMENTE:  Despus de una endoscopia inferior (colonoscopia o sigmoidoscopia flexible):  Cantidades excesivas de sangre en las heces fecales  Sensibilidad significativa o empeoramiento de los dolores abdominales   Hinchazn aguda del abdomen que antes no tena   Fiebre de 100F o ms   Despus de la endoscopia superior (EGD)  Vmitos de Retail buyer o material como caf molido   Dolor en el pecho o dolor debajo de los omplatos que antes no tena   Dolor o dificultad persistente para tragar  Falta de aire que antes no tena   Fiebre de 100F o ms  Heces fecales negras y pegajosas   Para asuntos urgentes o de Associate Professor, puede comunicarse con un gastroenterlogo a cualquier hora llamando al [phone].  DIETA:  Recomendamos una comida pequea al principio, pero luego puede continuar con su dieta normal.  Tome muchos lquidos, Tax adviser las bebidas alcohlicas durante 24 horas.    ACTIVIDAD:  Debe planear tomarse las cosas con calma por el resto del da y no debe CONDUCIR ni usar maquinaria pesada Patent examiner (debido a los medicamentos de sedacin utilizados durante el examen).     SEGUIMIENTO: Nuestro personal llamar al nmero que aparece en su historial al siguiente da hbil de su procedimiento para ver cmo se siente y para responder cualquier pregunta o inquietud que pueda tener con respecto a la informacin que se le dio despus del procedimiento. Si no podemos contactarle, le dejaremos un  mensaje.  Sin embargo, si se siente bien y no tiene English as a second language teacher, no es necesario que nos devuelva la llamada.  Asumiremos que ha regresado a sus actividades diarias normales sin incidentes. Si se le tomaron algunas biopsias, le contactaremos por telfono o por carta en las prximas 3 semanas.  Si no ha sabido [employer] biopsias en el transcurso de 3 semanas, por favor llmenos al [phone].   FIRMAS/CONFIDENCIALIDAD: Usted y/o el acompaante que le cuide han firmado documentos que se ingresarn en su historial mdico electrnico.  Estas firmas atestiguan el hecho de que la informacin anterior

## 2022-10-27 ENCOUNTER — Telehealth: Payer: Self-pay | Admitting: *Deleted

## 2022-10-27 NOTE — Telephone Encounter (Signed)
  Follow up Call-     10/24/2022    1:49 PM  Call back number  Post procedure Call Back phone  # [phone]  Permission to leave phone message No     Patient questions:  Do you have a fever, pain , or abdominal swelling? No. Pain Score  0 *  Have you tolerated food without any problems? Yes.    Have you been able to return to your normal activities? Yes.    Do you have any questions about your discharge instructions: Diet   No. Medications  No. Follow up visit  No.  Do you have questions or concerns about your Care? No.  Actions: * If pain score is 4 or above: No action needed, pain <4.

## 2022-11-03 DIAGNOSIS — E782 Mixed hyperlipidemia: Secondary | ICD-10-CM | POA: Diagnosis not present

## 2022-11-03 DIAGNOSIS — E1165 Type 2 diabetes mellitus with hyperglycemia: Secondary | ICD-10-CM | POA: Diagnosis not present

## 2022-11-03 DIAGNOSIS — D509 Iron deficiency anemia, unspecified: Secondary | ICD-10-CM | POA: Diagnosis not present

## 2022-11-05 ENCOUNTER — Telehealth: Payer: Self-pay | Admitting: Gastroenterology

## 2022-11-05 NOTE — Telephone Encounter (Signed)
Patients daughter called stating the patient received a letter from the office regarding path results. Asked for a call back.

## 2022-11-05 NOTE — Telephone Encounter (Signed)
Did not receive a call when patient's daughter called into the office. No callback number provider. I found number for Ruby on DPR form [phone]. Called via interpreter twice, we were able to leave a vm request Ruby give [place] a call back to discuss patient's biopsy results.

## 2022-11-06 ENCOUNTER — Other Ambulatory Visit: Payer: Self-pay

## 2022-11-06 DIAGNOSIS — D3A8 Other benign neuroendocrine tumors: Secondary | ICD-10-CM

## 2022-11-06 NOTE — Telephone Encounter (Signed)
Called and spoke with Ruby, see 10/24/22 pathology result note for details.

## 2022-11-07 ENCOUNTER — Other Ambulatory Visit: Payer: Self-pay

## 2022-11-07 ENCOUNTER — Other Ambulatory Visit: Payer: 59

## 2022-11-07 DIAGNOSIS — Z6834 Body mass index (BMI) 34.0-34.9, adult: Secondary | ICD-10-CM | POA: Diagnosis not present

## 2022-11-07 DIAGNOSIS — E1165 Type 2 diabetes mellitus with hyperglycemia: Secondary | ICD-10-CM | POA: Diagnosis not present

## 2022-11-07 DIAGNOSIS — D3A8 Other benign neuroendocrine tumors: Secondary | ICD-10-CM

## 2022-11-07 DIAGNOSIS — K625 Hemorrhage of anus and rectum: Secondary | ICD-10-CM

## 2022-11-07 DIAGNOSIS — Z86010 Personal history of colonic polyps: Secondary | ICD-10-CM | POA: Diagnosis not present

## 2022-11-07 DIAGNOSIS — Z79899 Other long term (current) drug therapy: Secondary | ICD-10-CM | POA: Diagnosis not present

## 2022-11-07 DIAGNOSIS — G990 Autonomic neuropathy in diseases classified elsewhere: Secondary | ICD-10-CM | POA: Diagnosis not present

## 2022-11-07 DIAGNOSIS — M179 Osteoarthritis of knee, unspecified: Secondary | ICD-10-CM | POA: Diagnosis not present

## 2022-11-07 DIAGNOSIS — E782 Mixed hyperlipidemia: Secondary | ICD-10-CM | POA: Diagnosis not present

## 2022-11-07 DIAGNOSIS — R7401 Elevation of levels of liver transaminase levels: Secondary | ICD-10-CM | POA: Diagnosis not present

## 2022-11-07 DIAGNOSIS — K644 Residual hemorrhoidal skin tags: Secondary | ICD-10-CM | POA: Diagnosis not present

## 2022-11-07 DIAGNOSIS — K922 Gastrointestinal hemorrhage, unspecified: Secondary | ICD-10-CM | POA: Diagnosis not present

## 2022-11-07 DIAGNOSIS — E669 Obesity, unspecified: Secondary | ICD-10-CM | POA: Diagnosis not present

## 2022-11-12 LAB — CHROMOGRANIN A: Chromogranin A (ng/mL): 35.3 ng/mL (ref 0.0–101.8)

## 2022-12-09 ENCOUNTER — Ambulatory Visit: Payer: 59 | Admitting: Gastroenterology

## 2022-12-26 ENCOUNTER — Encounter (HOSPITAL_COMMUNITY): Payer: Self-pay | Admitting: Gastroenterology

## 2022-12-26 NOTE — Progress Notes (Signed)
**  Using interpretive services*Attempted to obtain medical history via telephone, unable to reach at this time. Unable to leave voicemail to return pre surgical testing department's phone call.

## 2022-12-31 NOTE — Progress Notes (Signed)
*  Using Interpretive services*Attempted to obtain medical history via telephone, unable to reach at this time. Unable to leave voicemail to return pre surgical testing department's phone call. Checked with office we had correct number and they had same number.

## 2023-01-01 ENCOUNTER — Ambulatory Visit (HOSPITAL_COMMUNITY)
Admission: RE | Admit: 2023-01-01 | Discharge: 2023-01-01 | Disposition: A | Discharge status: Home / Self Care | Payer: 59 | Attending: Gastroenterology | Admitting: Gastroenterology

## 2023-01-01 ENCOUNTER — Ambulatory Visit (HOSPITAL_BASED_OUTPATIENT_CLINIC_OR_DEPARTMENT_OTHER): Payer: 59 | Admitting: Anesthesiology

## 2023-01-01 ENCOUNTER — Encounter (HOSPITAL_COMMUNITY): Payer: Self-pay | Admitting: Gastroenterology

## 2023-01-01 ENCOUNTER — Encounter (HOSPITAL_COMMUNITY)
Admission: RE | Disposition: A | Discharge status: Home / Self Care | Payer: Self-pay | Source: Home / Self Care | Attending: Gastroenterology

## 2023-01-01 ENCOUNTER — Ambulatory Visit (HOSPITAL_COMMUNITY): Payer: 59 | Admitting: Anesthesiology

## 2023-01-01 DIAGNOSIS — K6289 Other specified diseases of anus and rectum: Secondary | ICD-10-CM | POA: Diagnosis not present

## 2023-01-01 DIAGNOSIS — K641 Second degree hemorrhoids: Secondary | ICD-10-CM | POA: Insufficient documentation

## 2023-01-01 DIAGNOSIS — E669 Obesity, unspecified: Secondary | ICD-10-CM | POA: Insufficient documentation

## 2023-01-01 DIAGNOSIS — K621 Rectal polyp: Secondary | ICD-10-CM | POA: Insufficient documentation

## 2023-01-01 DIAGNOSIS — K625 Hemorrhage of anus and rectum: Secondary | ICD-10-CM

## 2023-01-01 DIAGNOSIS — D3A8 Other benign neuroendocrine tumors: Secondary | ICD-10-CM | POA: Diagnosis not present

## 2023-01-01 DIAGNOSIS — Z9889 Other specified postprocedural states: Secondary | ICD-10-CM | POA: Insufficient documentation

## 2023-01-01 DIAGNOSIS — I899 Noninfective disorder of lymphatic vessels and lymph nodes, unspecified: Secondary | ICD-10-CM | POA: Diagnosis not present

## 2023-01-01 DIAGNOSIS — I10 Essential (primary) hypertension: Secondary | ICD-10-CM | POA: Diagnosis not present

## 2023-01-01 HISTORY — PX: FLEXIBLE SIGMOIDOSCOPY: SHX5431

## 2023-01-01 HISTORY — PX: HEMOSTASIS CLIP PLACEMENT: SHX6857

## 2023-01-01 HISTORY — PX: ENDOSCOPIC MUCOSAL RESECTION: SHX6839

## 2023-01-01 HISTORY — PX: SUBMUCOSAL LIFTING INJECTION: SHX6855

## 2023-01-01 HISTORY — PX: EUS: SHX5427

## 2023-01-01 SURGERY — ULTRASOUND, LOWER GI TRACT, ENDOSCOPIC
Anesthesia: Monitor Anesthesia Care

## 2023-01-01 MED ORDER — PROPOFOL 1000 MG/100ML IV EMUL
INTRAVENOUS | Status: AC
  Filled 2023-01-01: qty 100

## 2023-01-01 MED ORDER — SODIUM CHLORIDE 0.9 % IV SOLN: INTRAVENOUS | Status: DC

## 2023-01-01 MED ORDER — LIDOCAINE 2% (20 MG/ML) 5 ML SYRINGE
INTRAMUSCULAR | Status: DC | PRN
  Administered 2023-01-01: 100 mg via INTRAVENOUS

## 2023-01-01 MED ORDER — PROPOFOL 500 MG/50ML IV EMUL
INTRAVENOUS | Status: DC | PRN
  Administered 2023-01-01: 130 ug/kg/min via INTRAVENOUS

## 2023-01-01 MED ORDER — PROPOFOL 10 MG/ML IV BOLUS
INTRAVENOUS | Status: DC | PRN
  Administered 2023-01-01: 20 mg via INTRAVENOUS

## 2023-01-01 NOTE — Plan of Care (Signed)
[facility] Tonsillectomy/Adenoidectomy, Postoperative PEDS care plan entered in error.

## 2023-01-01 NOTE — H&P (Signed)
GASTROENTEROLOGY PROCEDURE H&P NOTE   Primary Care Physician: Benita Stabile, MD  HPI: Gloria Nelson is a 57 y.o. 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  Past Medical History:  Diagnosis Date   Anxiety    Carpal tunnel syndrome, bilateral    Colon polyps    Depression with anxiety    History of anemia    Hyperlipidemia    Hypertension    IDA (iron deficiency anemia)    Left sided sciatica    Obesity    Paresthesias    Prediabetes    Past Surgical History:  Procedure Laterality Date   COLONOSCOPY  06/22/2004   Images only (marked for scanning), no report.   COLONOSCOPY N/A 01/30/2016   Procedure: COLONOSCOPY;  Surgeon: Corbin Ade, MD;  Location: [facility];  Service: Endoscopy;  Laterality: N/A;  830   ENDOMETRIAL ABLATION  2006   ovarian cyst removed   Current Facility-Administered Medications  Medication Dose Route Frequency Provider Last Rate Last Admin   0.9 %  sodium chloride infusion   Intravenous Continuous Mansouraty, Netty Starring., MD        Current Facility-Administered Medications:    0.9 %  sodium chloride infusion, , Intravenous, Continuous, Mansouraty, Netty Starring., MD Allergies  Allergen Reactions   Other     No blood products   Family History  Problem Relation Age of Onset   Hypertension Mother    Obesity Mother    Diabetes Father    Kidney disease Father    Blindness Father    Hypertension Sister    Hyperlipidemia Sister    Ovarian cancer Sister    Diabetes Brother    Colon polyps Maternal Uncle    Anxiety disorder Daughter    Anxiety disorder Son    Colon cancer Neg Hx    Esophageal cancer Neg Hx    Rectal cancer Neg Hx    Stomach cancer Neg Hx    Social History   Socioeconomic History   Marital status: Married    Spouse name: Not on file   Number of children: 2   Years of education: Not on file   Highest education level: Not on file  Occupational History    Occupation: Location manager  Tobacco Use   Smoking status: Never   Smokeless tobacco: Never  Vaping Use   Vaping status: Never Used  Substance and Sexual Activity   Alcohol use: No   Drug use: No   Sexual activity: Not on file  Other Topics Concern   Not on file  Social History Narrative   Not on file   Social Determinants of Health   Financial Resource Strain: Not on file  Food Insecurity: No Food Insecurity (09/08/2022)   Hunger Vital Sign    Worried About Running Out of Food in the Last Year: Never true    Ran Out of Food in the Last Year: Never true  Transportation Needs: No Transportation Needs (09/08/2022)   PRAPARE - Administrator, Civil Service (Medical): No    Lack of Transportation (Non-Medical): No  Physical Activity: Not on file  Stress: Not on file  Social Connections: Not on file  Intimate Partner Violence: Not At Risk (09/08/2022)   Humiliation, Afraid, Rape, and Kick questionnaire    Fear of Current or Ex-Partner: No    Emotionally Abused: No    Physically Abused: No    Sexually Abused: No  Physical Exam: There were no vitals filed for this visit. There is no height or weight on file to calculate BMI. GEN: NAD EYE: Sclerae anicteric ENT: MMM CV: Non-tachycardic GI: Soft, NT/ND NEURO:  Alert & Oriented x 3  Lab Results: No results for input(s): "WBC", "HGB", "HCT", "PLT" in the last 72 hours. BMET No results for input(s): "NA", "K", "CL", "CO2", "GLUCOSE", "BUN", "CREATININE", "CALCIUM" in the last 72 hours. LFT No results for input(s): "PROT", "ALBUMIN", "AST", "ALT", "ALKPHOS", "BILITOT", "BILIDIR", "IBILI" in the last 72 hours. PT/INR No results for input(s): "LABPROT", "INR" in the last 72 hours.   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  The risks of an EUS including intestinal perforation, bleeding, infection, aspiration,  and medication effects were discussed as was the possibility it may not give a definitive diagnosis if a biopsy is performed.  The risks and benefits of endoscopic evaluation/treatment were discussed with the patient and/or family; these include but are not limited to the risk of perforation, infection, bleeding, missed lesions, lack of diagnosis, severe illness requiring hospitalization, as well as anesthesia and sedation related illnesses.  The patient's history has been reviewed, patient examined, no change in status, and deemed stable for procedure.  The patient and/or family is agreeable to proceed.    Corliss Parish, MD [facility] Office # [phone]

## 2023-01-01 NOTE — Transfer of Care (Signed)
Immediate Anesthesia Transfer of Care Note  Patient: Gloria Nelson  Procedure(s) Performed: LOWER ENDOSCOPIC ULTRASOUND (EUS) SUBMUCOSAL LIFTING INJECTION ENDOSCOPIC MUCOSAL RESECTION HEMOSTASIS CLIP PLACEMENT  Patient Location: PACU  Anesthesia Type:MAC  Level of Consciousness: sedated  Airway & Oxygen Therapy: Patient Spontanous Breathing and Patient connected to face mask oxygen  Post-op Assessment: Report given to RN and Post -op Vital signs reviewed and stable  Post vital signs: Reviewed and stable  Last Vitals:  Vitals Value Taken Time  BP 110/63 01/01/23 1355  Temp 36.1 C 01/01/23 1355  Pulse 62 01/01/23 1356  Resp 11 01/01/23 1356  SpO2 100 % 01/01/23 1356  Vitals shown include unfiled device data.  Last Pain:  Vitals:   01/01/23 1355  TempSrc: Temporal  PainSc: Asleep         Complications: No notable events documented.

## 2023-01-01 NOTE — Anesthesia Postprocedure Evaluation (Signed)
Anesthesia Post Note  Patient: Gloria Nelson  Procedure(s) Performed: LOWER ENDOSCOPIC ULTRASOUND (EUS) SUBMUCOSAL LIFTING INJECTION ENDOSCOPIC MUCOSAL RESECTION HEMOSTASIS CLIP PLACEMENT     Patient location during evaluation: PACU Anesthesia Type: MAC Level of consciousness: awake and alert Pain management: pain level controlled Vital Signs Assessment: post-procedure vital signs reviewed and stable Respiratory status: spontaneous breathing, nonlabored ventilation and respiratory function stable Cardiovascular status: stable and blood pressure returned to baseline Anesthetic complications: no   No notable events documented.  Last Vitals:  Vitals:   01/01/23 1410 01/01/23 1420  BP: (!) 135/58 (!) 142/72  Pulse: 66 (!) 57  Resp: 12 13  Temp:    SpO2: 95% 96%    Last Pain:  Vitals:   01/01/23 1420  TempSrc:   PainSc: 0-No pain                 Beryle Lathe

## 2023-01-01 NOTE — Op Note (Signed)
[facility] Patient Name: Gloria Nelson Procedure Date: 01/01/2023 MRN: [id number] Attending MD: Corliss Parish , MD, [id number] Date of Birth: [birth year] CSN: [id number] Age: 57 Admit Type: Outpatient Procedure:                Lower EUS Indications:              Rectal deformity found on endoscopy; subepithelial                            tumor versus extrinsic compression, Neuroendocrine                            Tumor Providers:                Corliss Parish, MD, Margaree Mackintosh, RN,                            Harrington Challenger, Technician Referring MD:             Willaim Rayas. Adela Lank, MD, Kathleene Hazel. Margo Aye MD Medicines:                Monitored Anesthesia Care Complications:            No immediate complications. Estimated Blood Loss:     Estimated blood loss was minimal. Procedure:                Pre-Anesthesia Assessment:                           - Prior to the procedure, a History and Physical                            was performed, and patient medications and                            allergies were reviewed. The patient's tolerance of                            previous anesthesia was also reviewed. The risks                            and benefits of the procedure and the sedation                            options and risks were discussed with the patient.                            All questions were answered, and informed consent                            was obtained. Prior Anticoagulants: The patient has                            taken no anticoagulant or antiplatelet agents. ASA  Grade Assessment: II - A patient with mild systemic                            disease. After reviewing the risks and benefits,                            the patient was deemed in satisfactory condition to                            undergo the procedure.                           After obtaining informed consent, the endoscope was                             passed under direct vision. Throughout the                            procedure, the patient's blood pressure, pulse, and                            oxygen saturations were monitored continuously. The                            [id number] ([id number]) Olympus therapeutic endoscope                            was introduced through the anus and advanced to the                            the descending colon. The [id number] ([id number])                            Olympus radial ultrasound scope was introduced                            through the anus and advanced to the the sigmoid                            colon for ultrasound. The lower EUS was                            accomplished without difficulty. The patient                            tolerated the procedure. Scope In: 1:17:58 PM Scope Out: 1:36:35 PM Total Procedure Duration: 0 hours 18 minutes 37 seconds  Findings:      The digital rectal exam findings include hemorrhoids. Pertinent       negatives include no palpable rectal lesions.      ENDOSCOPIC FINDING: :      A tattoo was seen in the distal rectum. A post-polypectomy scar was       found at the tattoo site.      A small post polypectomy scar was found  in the distal rectum. There was       residual polypoid tissue but it appeared to be more granular rather than       persisting NET. After EUS, preparations were made for further mucosal       resection. Demarcation of the lesion was performed with high-definition       white light and narrow band imaging to clearly identify the boundaries       of the lesion. EverLift was injected to raise the lesion. Band ligator       and snare mucosal resection was performed. Resection and retrieval were       complete. Resected tissue margins were examined and clear of polyp       tissue. To close the defect after mucosal resection, four hemostatic       clips were successfully placed (MR conditional). There was no  bleeding       during, or at the end, of the procedure.      Normal mucosa was found in the rest of the visualized left colon.      Non-bleeding non-thrombosed external and internal hemorrhoids were found       during retroflexion, during perianal exam and during digital exam. The       hemorrhoids were Grade II (internal hemorrhoids that prolapse but reduce       spontaneously).      ENDOSONOGRAPHIC FINDING: :      The rectosigmoid junction was normal.      Local wall thickening was found in the rectum (at scar site). This was       encountered from 4 to 5 cm (from the anal verge). This finding appeared       to be primarily due to increased thickness of the deep mucosa (Layer 2).      No malignant-appearing lymph nodes were visualized in the perirectal       region and in the left iliac region. The nodes were.      The internal anal sphincter was visualized endosonographically and       appeared normal. Impression:               FLEX Impression:                           - Hemorrhoids found on digital rectal exam.                           - A tattoo was seen in the distal rectum. A                            post-polypectomy scar was found at the tattoo site.                            After EUS, EMR of the scar was performed as noted                            above. Clips (MR conditional) were placed.                           - Normal mucosa in the rest of the visualized colon.                           -  Non-bleeding non-thrombosed external and internal                            hemorrhoids.                           EUS Impression:                           - Endosonographic imaging showed no sign of                            significant pathology at the rectosigmoid junction.                           - Local wall thickening was visualized                            endosonographically in the rectum. This was due to                            increased thickness of the deep  mucosa (Layer 2).                           - No malignant-appearing lymph nodes were                            visualized endosonographically in the perirectal                            region and in the left iliac region.                           - The internal anal sphincter was visualized                            endosonographically and appeared normal. Moderate Sedation:      Not Applicable - Patient had care per Anesthesia. Recommendation:           - The patient will be observed post-procedure,                            until all discharge criteria are met.                           - Discharge patient to home.                           - Patient has a contact number available for                            emergencies. The signs and symptoms of potential                            delayed complications were discussed with the  patient. Return to normal activities tomorrow.                            Written discharge instructions were provided to the                            patient.                           - Resume previous diet.                           - Await path results.                           - Repeat lower endoscopic ultrasound in 1-2 years                            for surveillance based on final pathology.                           - The findings and recommendations were discussed                            with the patient.                           - The findings and recommendations were discussed                            with the patient's family. Procedure Code(s):        --- Professional ---                           [phone], Sigmoidoscopy, flexible; with endoscopic                            mucosal resection                           45341, Sigmoidoscopy, flexible; with endoscopic                            ultrasound examination Diagnosis Code(s):        --- Professional ---                           K64.1, Second  degree hemorrhoids                           Z98.890, Other specified postprocedural states                           K62.89, Other specified diseases of anus and rectum                           I89.9, Noninfective disorder of lymphatic vessels  and lymph nodes, unspecified CPT copyright 2022 American Medical Association. All rights reserved. The codes documented in this report are preliminary and upon coder review may  be revised to meet current compliance requirements. Corliss Parish, MD 01/01/2023 2:04:15 PM Number of Addenda: 0

## 2023-01-01 NOTE — Discharge Instructions (Signed)

## 2023-01-01 NOTE — Anesthesia Preprocedure Evaluation (Addendum)
Anesthesia Evaluation  Patient identified by MRN, date of birth, ID band Patient awake    Reviewed: Allergy & Precautions, NPO status , Patient's Chart, lab work & pertinent test results  History of Anesthesia Complications Negative for: history of anesthetic complications  Airway Mallampati: II  TM Distance: >3 FB Neck ROM: Full    Dental  (+) Dental Advisory Given, Teeth Intact   Pulmonary neg pulmonary ROS   Pulmonary exam normal        Cardiovascular hypertension, Normal cardiovascular exam     Neuro/Psych  PSYCHIATRIC DISORDERS Anxiety Depression     Neuromuscular disease    GI/Hepatic negative GI ROS, Neg liver ROS,,,  Endo/Other   Pre-DM Obesity   Renal/GU negative Renal ROS     Musculoskeletal negative musculoskeletal ROS (+)    Abdominal   Peds  Hematology negative hematology ROS (+)   Anesthesia Other Findings   Reproductive/Obstetrics                             Anesthesia Physical Anesthesia Plan  ASA: 2  Anesthesia Plan: MAC   Post-op Pain Management: Minimal or no pain anticipated   Induction: Intravenous  PONV Risk Score and Plan: 2 and Propofol infusion and Treatment may vary due to age or medical condition  Airway Management Planned: Nasal Cannula and Natural Airway  Additional Equipment: None  Intra-op Plan:   Post-operative Plan:   Informed Consent: I have reviewed the patients History and Physical, chart, labs and discussed the procedure including the risks, benefits and alternatives for the proposed anesthesia with the patient or authorized representative who has indicated his/her understanding and acceptance.     Dental advisory given and Interpreter used for interveiw  Plan Discussed with: CRNA and Anesthesiologist  Anesthesia Plan Comments:        Anesthesia Quick Evaluation

## 2023-01-02 ENCOUNTER — Encounter (HOSPITAL_COMMUNITY): Payer: Self-pay | Admitting: Internal Medicine

## 2023-01-05 ENCOUNTER — Encounter: Payer: Self-pay | Admitting: Gastroenterology

## 2023-01-05 ENCOUNTER — Encounter (HOSPITAL_COMMUNITY): Payer: Self-pay | Admitting: Gastroenterology

## 2023-01-05 LAB — SURGICAL PATHOLOGY

## 2023-01-07 ENCOUNTER — Other Ambulatory Visit (HOSPITAL_COMMUNITY): Payer: Self-pay | Admitting: Internal Medicine

## 2023-01-07 DIAGNOSIS — Z1231 Encounter for screening mammogram for malignant neoplasm of breast: Secondary | ICD-10-CM

## 2023-01-12 ENCOUNTER — Ambulatory Visit (HOSPITAL_COMMUNITY)
Admission: RE | Admit: 2023-01-12 | Discharge: 2023-01-12 | Disposition: A | Discharge status: Home / Self Care | Payer: 59 | Source: Ambulatory Visit | Attending: Internal Medicine | Admitting: Internal Medicine

## 2023-01-12 DIAGNOSIS — Z1231 Encounter for screening mammogram for malignant neoplasm of breast: Secondary | ICD-10-CM | POA: Diagnosis not present

## 2023-01-20 ENCOUNTER — Other Ambulatory Visit (HOSPITAL_COMMUNITY): Payer: Self-pay | Admitting: Internal Medicine

## 2023-01-20 DIAGNOSIS — R928 Other abnormal and inconclusive findings on diagnostic imaging of breast: Secondary | ICD-10-CM

## 2023-01-27 ENCOUNTER — Ambulatory Visit (HOSPITAL_COMMUNITY)
Admission: RE | Admit: 2023-01-27 | Discharge: 2023-01-27 | Disposition: A | Discharge status: Home / Self Care | Payer: 59 | Source: Ambulatory Visit | Attending: Internal Medicine | Admitting: Internal Medicine

## 2023-01-27 DIAGNOSIS — R928 Other abnormal and inconclusive findings on diagnostic imaging of breast: Secondary | ICD-10-CM | POA: Insufficient documentation

## 2023-01-27 DIAGNOSIS — R92333 Mammographic heterogeneous density, bilateral breasts: Secondary | ICD-10-CM | POA: Diagnosis not present

## 2023-06-08 ENCOUNTER — Ambulatory Visit (HOSPITAL_COMMUNITY)
Admission: RE | Admit: 2023-06-08 | Discharge: 2023-06-08 | Disposition: A | Discharge status: Home / Self Care | Source: Ambulatory Visit | Attending: Nurse Practitioner | Admitting: Nurse Practitioner

## 2023-06-08 ENCOUNTER — Other Ambulatory Visit (HOSPITAL_COMMUNITY): Payer: Self-pay | Admitting: Nurse Practitioner

## 2023-06-08 DIAGNOSIS — R059 Cough, unspecified: Secondary | ICD-10-CM

## 2023-09-08 ENCOUNTER — Other Ambulatory Visit (HOSPITAL_COMMUNITY): Payer: Self-pay

## 2023-09-08 ENCOUNTER — Ambulatory Visit (HOSPITAL_COMMUNITY)
Admission: RE | Admit: 2023-09-08 | Discharge: 2023-09-08 | Disposition: A | Discharge status: Home / Self Care | Source: Ambulatory Visit

## 2023-09-08 DIAGNOSIS — R509 Fever, unspecified: Secondary | ICD-10-CM

## 2023-09-09 ENCOUNTER — Other Ambulatory Visit (HOSPITAL_COMMUNITY): Payer: Self-pay

## 2023-09-09 DIAGNOSIS — R509 Fever, unspecified: Secondary | ICD-10-CM

## 2023-09-23 ENCOUNTER — Encounter (HOSPITAL_COMMUNITY): Payer: Self-pay

## 2023-09-23 ENCOUNTER — Ambulatory Visit (HOSPITAL_COMMUNITY)

## 2024-02-01 NOTE — Progress Notes (Unsigned)
 Chief Complaint: No chief complaint on file.   History of Present Illness:  Gloria Nelson is a 58 y.o. female who is seen in consultation from Shona Norleen PEDLAR, MD for evaluation of ***.   Past Medical History:  Past Medical History:  Diagnosis Date   Anxiety    Carpal tunnel syndrome, bilateral    Colon polyps    Depression with anxiety    History of anemia    Hyperlipidemia    Hypertension    IDA (iron deficiency anemia)    Left sided sciatica    Obesity    Paresthesias    Prediabetes     Past Surgical History:  Past Surgical History:  Procedure Laterality Date   COLONOSCOPY  06/22/2004   Images only (marked for scanning), no report.   COLONOSCOPY N/A 01/30/2016   Procedure: COLONOSCOPY;  Surgeon: Lamar CHRISTELLA Hollingshead, MD;  Location: [facility];  Service: Endoscopy;  Laterality: N/A;  830   ENDOMETRIAL ABLATION  2006   ovarian cyst removed   ENDOSCOPIC MUCOSAL RESECTION  01/01/2023   Procedure: ENDOSCOPIC MUCOSAL RESECTION;  Surgeon: Wilhelmenia Aloha Raddle., MD;  Location: [facility];  Service: Gastroenterology;;   EUS N/A 01/01/2023   Procedure: LOWER ENDOSCOPIC ULTRASOUND (EUS);  Surgeon: Wilhelmenia Aloha Raddle., MD;  Location: [facility];  Service: Gastroenterology;  Laterality: N/A;   FLEXIBLE SIGMOIDOSCOPY N/A 01/01/2023   Procedure: FLEXIBLE SIGMOIDOSCOPY;  Surgeon: Wilhelmenia Aloha Raddle., MD;  Location: [facility];  Service: Gastroenterology;  Laterality: N/A;   HEMOSTASIS CLIP PLACEMENT  01/01/2023   Procedure: HEMOSTASIS CLIP PLACEMENT;  Surgeon: Wilhelmenia Aloha Raddle., MD;  Location: [facility];  Service: Gastroenterology;;   SUBMUCOSAL LIFTING INJECTION  01/01/2023   Procedure: SUBMUCOSAL LIFTING INJECTION;  Surgeon: Wilhelmenia Aloha Raddle., MD;  Location: [facility];  Service: Gastroenterology;;    Allergies:  Allergies  Allergen Reactions   Other     No blood products    Family History:  Family History  Problem Relation Age of Onset    Hypertension Mother    Obesity Mother    Diabetes Father    Kidney disease Father    Blindness Father    Hypertension Sister    Hyperlipidemia Sister    Ovarian cancer Sister    Diabetes Brother    Colon polyps Maternal Uncle    Anxiety disorder Daughter    Anxiety disorder Son    Colon cancer Neg Hx    Esophageal cancer Neg Hx    Rectal cancer Neg Hx    Stomach cancer Neg Hx     Social History:  Social History   Tobacco Use   Smoking status: Never   Smokeless tobacco: Never  Vaping Use   Vaping status: Never Used  Substance Use Topics   Alcohol use: No   Drug use: No    Review of symptoms:  Constitutional:  Negative for unexplained weight loss, night sweats, fever, chills ENT:  Negative for nose bleeds, sinus pain, painful swallowing CV:  Negative for chest pain, shortness of breath, exercise intolerance, palpitations, loss of consciousness Resp:  Negative for cough, wheezing, shortness of breath GI:  Negative for nausea, vomiting, diarrhea, bloody stools GU:  Positives noted in HPI; otherwise negative for gross hematuria, dysuria, urinary incontinence Neuro:  Negative for seizures, poor balance, limb weakness, slurred speech Psych:  Negative for lack of energy, depression, anxiety Endocrine:  Negative for polydipsia, polyuria, symptoms of hypoglycemia (dizziness, hunger, sweating) Hematologic:  Negative for anemia, purpura, petechia, prolonged or excessive  bleeding, use of anticoagulants  Allergic:  Negative for difficulty breathing or choking as a result of exposure to anything; no shellfish allergy; no allergic response (rash/itch) to materials, foods  Physical exam: LMP 08/28/2015 (Approximate)  GENERAL APPEARANCE:  Well appearing, well developed, well nourished, NAD HEENT: Atraumatic, Normocephalic, oropharynx clear. NECK: Supple without lymphadenopathy or thyromegaly. LUNGS: Clear to auscultation bilaterally. HEART: Regular Rate and Rhythm without murmurs,  gallops, or rubs. ABDOMEN: Soft, non-tender, No Masses. EXTREMITIES: Moves all extremities well.  Without clubbing, cyanosis, or edema. NEUROLOGIC:  Alert and oriented x 3, normal gait, CN II-XII grossly intact.  MENTAL STATUS:  Appropriate. BACK:  Non-tender to palpation.  No CVAT SKIN:  Warm, dry and intact.    Results: No results found for this or any previous visit (from the past 24 hours).  I have reviewed prior patient's records  I have reviewed referring/prior physicians records  I have reviewed urinalysis  I have reviewed prior urine cultures  I reviewed prior imaging studies  Assessment: No diagnosis found.   Plan: ***

## 2024-02-02 ENCOUNTER — Ambulatory Visit: Admitting: Urology

## 2024-02-02 VITALS — BP 123/72 | HR 73

## 2024-02-02 DIAGNOSIS — Z87440 Personal history of urinary (tract) infections: Secondary | ICD-10-CM

## 2024-02-02 DIAGNOSIS — N952 Postmenopausal atrophic vaginitis: Secondary | ICD-10-CM

## 2024-02-02 DIAGNOSIS — N10 Acute tubulo-interstitial nephritis: Secondary | ICD-10-CM

## 2024-02-02 DIAGNOSIS — N390 Urinary tract infection, site not specified: Secondary | ICD-10-CM

## 2024-02-02 DIAGNOSIS — Z09 Encounter for follow-up examination after completed treatment for conditions other than malignant neoplasm: Secondary | ICD-10-CM | POA: Diagnosis not present

## 2024-02-02 LAB — URINALYSIS, ROUTINE W REFLEX MICROSCOPIC
Bilirubin, UA: NEGATIVE
Glucose, UA: NEGATIVE
Ketones, UA: NEGATIVE
Nitrite, UA: NEGATIVE
Protein,UA: NEGATIVE
RBC, UA: NEGATIVE
Specific Gravity, UA: 1.01 (ref 1.005–1.030)
Urobilinogen, Ur: 0.2 mg/dL (ref 0.2–1.0)
pH, UA: 6.5 (ref 5.0–7.5)

## 2024-02-02 LAB — MICROSCOPIC EXAMINATION: Bacteria, UA: NONE SEEN

## 2024-02-02 MED ORDER — METHENAMINE HIPPURATE 1 G PO TABS: 1.0000 g | ORAL_TABLET | Freq: Two times a day (BID) | ORAL | 11 refills | Status: AC

## 2024-02-02 NOTE — Progress Notes (Signed)
 Bladder Scan completed today due to reason acute pyelonephritis   Patient can void prior to the bladder scan. Bladder scan result: 0  Performed By: Exie T. CMA  Additional notes- Patient is scheduled to follow up with MD

## 2024-02-12 ENCOUNTER — Emergency Department (HOSPITAL_COMMUNITY): Admission: EM | Admit: 2024-02-12 | Discharge: 2024-02-12 | Disposition: A | Discharge status: Home / Self Care

## 2024-02-12 ENCOUNTER — Other Ambulatory Visit: Payer: Self-pay

## 2024-02-12 ENCOUNTER — Encounter (HOSPITAL_COMMUNITY): Payer: Self-pay | Admitting: *Deleted

## 2024-02-12 DIAGNOSIS — D649 Anemia, unspecified: Secondary | ICD-10-CM | POA: Insufficient documentation

## 2024-02-12 LAB — CBC WITH DIFFERENTIAL/PLATELET
Abs Immature Granulocytes: 0.03 K/uL (ref 0.00–0.07)
Basophils Absolute: 0 K/uL (ref 0.0–0.1)
Basophils Relative: 0 %
Eosinophils Absolute: 0.2 K/uL (ref 0.0–0.5)
Eosinophils Relative: 3 %
HCT: 25.1 % — ABNORMAL LOW (ref 36.0–46.0)
Hemoglobin: 7.4 g/dL — ABNORMAL LOW (ref 12.0–15.0)
Immature Granulocytes: 1 %
Lymphocytes Relative: 26 %
Lymphs Abs: 1.7 K/uL (ref 0.7–4.0)
MCH: 29.8 pg (ref 26.0–34.0)
MCHC: 29.5 g/dL — ABNORMAL LOW (ref 30.0–36.0)
MCV: 101.2 fL — ABNORMAL HIGH (ref 80.0–100.0)
Monocytes Absolute: 0.4 K/uL (ref 0.1–1.0)
Monocytes Relative: 6 %
Neutro Abs: 4.1 K/uL (ref 1.7–7.7)
Neutrophils Relative %: 64 %
Platelets: 488 K/uL — ABNORMAL HIGH (ref 150–400)
RBC: 2.48 MIL/uL — ABNORMAL LOW (ref 3.87–5.11)
RDW: 21.7 % — ABNORMAL HIGH (ref 11.5–15.5)
Smear Review: NORMAL
WBC: 6.3 K/uL (ref 4.0–10.5)
nRBC: 0.5 % — ABNORMAL HIGH (ref 0.0–0.2)

## 2024-02-12 LAB — BASIC METABOLIC PANEL WITH GFR
Anion gap: 13 (ref 5–15)
BUN: 11 mg/dL (ref 6–20)
CO2: 24 mmol/L (ref 22–32)
Calcium: 9.4 mg/dL (ref 8.9–10.3)
Chloride: 106 mmol/L (ref 98–111)
Creatinine, Ser: 0.66 mg/dL (ref 0.44–1.00)
GFR, Estimated: >60 mL/min (ref >60)
Glucose, Bld: 167 mg/dL — ABNORMAL HIGH (ref 70–99)
Potassium: 3.7 mmol/L (ref 3.5–5.1)
Sodium: 142 mmol/L (ref 135–145)

## 2024-02-12 LAB — IRON AND TIBC
Iron: 370 ug/dL — ABNORMAL HIGH (ref 28–170)
Saturation Ratios: 87 % — ABNORMAL HIGH (ref 10.4–31.8)
TIBC: 426 ug/dL (ref 250–450)
UIBC: 56 ug/dL

## 2024-02-12 LAB — FERRITIN: Ferritin: 33 ng/mL (ref 11–307)

## 2024-02-12 MED ORDER — IRON SUCROSE 200 MG IVPB - SIMPLE MED
200.0000 mg | Freq: Once | Status: AC
  Administered 2024-02-12: 200 mg via INTRAVENOUS
  Filled 2024-02-12: qty 110

## 2024-02-12 NOTE — ED Triage Notes (Addendum)
 Pt with c/o HA and generalized weakness.  + SOB and CP yesterday, denies any CP at present. Pt sent here for possible Iron  transfusion. Pt refuses blood and with low HBG.

## 2024-02-12 NOTE — Discharge Instructions (Addendum)
 Call and follow-up with your primary care doctor next week to have repeat lab work.  Return to the ER for new or worsening symptoms.

## 2024-02-12 NOTE — ED Notes (Signed)
 Pt/family received d/c paperwork at this time. After going over the paperwork any questions, comments, or concerns were answered to the best of this nurse's knowledge. The pt/family verbally acknowledged the teachings/instructions.

## 2024-02-12 NOTE — ED Provider Notes (Signed)
 [place] EMERGENCY DEPARTMENT AT [facility] Provider Note   CSN: [id number] Arrival date & time: 02/12/24  1305     Patient presents with: Weakness   Gloria Nelson is a 58 y.o. female.   58 year old female who is Spanish-speaking presents for evaluation of weakness.  Daughter helps to provide history/translation.  She states that she has been feeling weak for a few days.  Has a history of anemia.  Was seen by her primary care doctor a couple days ago and told she had a low hemoglobin.  Per daughter they do not take blood products and so were sent in by primary care for an iron  infusion.  Patient states she has not had any bleeding, but has felt fatigued and somewhat short of breath yesterday.  She has had iron  infusions in the past when she has felt like this.   Weakness Associated symptoms: no abdominal pain, no arthralgias, no chest pain, no cough, no dysuria, no fever, no seizures, no shortness of breath and no vomiting        Prior to Admission medications   Medication Sig Start Date End Date Taking? Authorizing Provider  acetaminophen  (TYLENOL ) 500 MG tablet Take 500-1,000 mg by mouth every 8 (eight) hours as needed for mild pain.    Yes [provider]  ferrous sulfate 325 (65 FE) MG EC tablet Take 325 mg by mouth daily with breakfast.   Yes [provider]  fexofenadine (ALLEGRA ALLERGY) 180 MG tablet Take 180 mg by mouth daily as needed for allergies.   Yes [provider]  folic acid (FOLVITE) 400 MCG tablet Take 800 mcg by mouth daily.   Yes [provider]  metFORMIN (GLUCOPHAGE-XR) 500 MG 24 hr tablet Take 500 mg by mouth daily. 11/30/23  Yes [provider]  methenamine  (HIPREX ) 1 g tablet Take 1 tablet (1 g total) by mouth 2 (two) times daily with a meal. 02/02/24  Yes Dahlstedt, Garnette, MD  Rectal Protectant-Emollient (CALMOL-4) 76-10 % SUPP Apply a pea-sized amount of 1% hydrocortisone  cream to the suppository  before insertion.  Use twice daily 10/02/22  Yes Armbruster, Elspeth SQUIBB, MD  rosuvastatin  (CRESTOR ) 5 MG tablet Take 5 mg by mouth daily. 08/30/22  Yes [provider]    Allergies: Other    Review of Systems  Constitutional:  Negative for chills and fever.  HENT:  Negative for ear pain and sore throat.   Eyes:  Negative for pain and visual disturbance.  Respiratory:  Negative for cough and shortness of breath.   Cardiovascular:  Negative for chest pain and palpitations.  Gastrointestinal:  Negative for abdominal pain and vomiting.  Genitourinary:  Negative for dysuria and hematuria.  Musculoskeletal:  Negative for arthralgias and back pain.  Skin:  Negative for color change and rash.  Neurological:  Positive for weakness. Negative for seizures and syncope.  All other systems reviewed and are negative.   Updated Vital Signs BP (!) 156/85 (BP Location: Right Arm)   Pulse 77   Temp 97.7 F (36.5 C) (Oral)   Resp 17   Ht 4' 11 (1.499 m)   Wt 71.7 kg   LMP 08/28/2015 (Approximate)   SpO2 100%   BMI 31.91 kg/m   Physical Exam Vitals and nursing note reviewed.  Constitutional:      General: She is not in acute distress.    Appearance: Normal appearance. She is well-developed. She is not ill-appearing.  HENT:     Head: Normocephalic  and atraumatic.  Eyes:     Conjunctiva/sclera: Conjunctivae normal.  Cardiovascular:     Rate and Rhythm: Normal rate and regular rhythm.     Heart sounds: No murmur heard. Pulmonary:     Effort: Pulmonary effort is normal. No respiratory distress.     Breath sounds: Normal breath sounds.  Abdominal:     Palpations: Abdomen is soft.     Tenderness: There is no abdominal tenderness.  Musculoskeletal:        General: No swelling.     Cervical back: Neck supple.  Skin:    General: Skin is warm and dry.     Capillary Refill: Capillary refill takes less than 2 seconds.  Neurological:     Mental Status: She is alert.  Psychiatric:         Mood and Affect: Mood normal.     (all labs ordered are listed, but only abnormal results are displayed) Labs Reviewed  CBC WITH DIFFERENTIAL/PLATELET - Abnormal; Notable for the following components:      Result Value   RBC 2.48 (*)    Hemoglobin 7.4 (*)    HCT 25.1 (*)    MCV 101.2 (*)    MCHC 29.5 (*)    RDW 21.7 (*)    Platelets 488 (*)    nRBC 0.5 (*)    All other components within normal limits  BASIC METABOLIC PANEL WITH GFR - Abnormal; Notable for the following components:   Glucose, Bld 167 (*)    All other components within normal limits  IRON  AND TIBC  FERRITIN    EKG: None  Radiology: No results found.   Procedures   Medications Ordered in the ED  iron  sucrose (VENOFER ) 200 mg in sodium chloride  0.9 % 100 mL IVPB (0 mg Intravenous Stopped 02/12/24 1502)                                    Medical Decision Making Social determinants of health: spanish speaking   Patient here for symptomatic anemia.  Was sent in by primary care for iron  infusion.  Hemoglobin is 7.6.  Patient states her and her family do not take blood products.  She was given an iron  infusion and tolerated it well.  Advise close follow-up with primary care for reevaluation of her hemoglobin and iron  levels and to return for any new or worsening symptoms.  She feels comfortable being discharged.  All results and plan discussed with patient and family at bedside.  Problems Addressed: Symptomatic anemia: acute illness or injury  Amount and/or Complexity of Data Reviewed External Data Reviewed: notes.    Details: Prior lab work reviewed and patient has a hemoglobin frequently between 6 and 7 Labs: ordered. Decision-making details documented in ED Course.    Details: Ordered and reviewed by me and patient's hemoglobin is 7.6, no other acute abnormality  Risk OTC drugs. Prescription drug management. Drug therapy requiring intensive monitoring for toxicity. Diagnosis or treatment  significantly limited by social determinants of health.     Final diagnoses:  Symptomatic anemia    ED Discharge Orders     None          Gennaro Duwaine CROME, DO 02/12/24 1546

## 2024-03-22 ENCOUNTER — Ambulatory Visit: Admitting: Physician Assistant

## 2024-03-22 ENCOUNTER — Other Ambulatory Visit (INDEPENDENT_AMBULATORY_CARE_PROVIDER_SITE_OTHER)

## 2024-03-22 ENCOUNTER — Encounter: Payer: Self-pay | Admitting: Physician Assistant

## 2024-03-22 VITALS — BP 130/80 | HR 84 | Ht 59.0 in | Wt 164.0 lb

## 2024-03-22 DIAGNOSIS — T454X5A Adverse effect of iron and its compounds, initial encounter: Secondary | ICD-10-CM | POA: Diagnosis not present

## 2024-03-22 DIAGNOSIS — K625 Hemorrhage of anus and rectum: Secondary | ICD-10-CM

## 2024-03-22 DIAGNOSIS — D509 Iron deficiency anemia, unspecified: Secondary | ICD-10-CM

## 2024-03-22 DIAGNOSIS — D3A8 Other benign neuroendocrine tumors: Secondary | ICD-10-CM | POA: Diagnosis not present

## 2024-03-22 DIAGNOSIS — K648 Other hemorrhoids: Secondary | ICD-10-CM | POA: Diagnosis not present

## 2024-03-22 DIAGNOSIS — K649 Unspecified hemorrhoids: Secondary | ICD-10-CM

## 2024-03-22 DIAGNOSIS — K5903 Drug induced constipation: Secondary | ICD-10-CM

## 2024-03-22 LAB — CBC WITH DIFFERENTIAL/PLATELET
Basophils Absolute: 0 K/uL (ref 0.0–0.1)
Basophils Relative: 0.3 % (ref 0.0–3.0)
Eosinophils Absolute: 0.3 K/uL (ref 0.0–0.7)
Eosinophils Relative: 4.2 % (ref 0.0–5.0)
HCT: 36.8 % (ref 36.0–46.0)
Hemoglobin: 12.2 g/dL (ref 12.0–15.0)
Lymphocytes Relative: 34.1 % (ref 12.0–46.0)
Lymphs Abs: 2.2 K/uL (ref 0.7–4.0)
MCHC: 33 g/dL (ref 30.0–36.0)
MCV: 91.2 fl (ref 78.0–100.0)
Monocytes Absolute: 0.5 K/uL (ref 0.1–1.0)
Monocytes Relative: 7.1 % (ref 3.0–12.0)
Neutro Abs: 3.6 K/uL (ref 1.4–7.7)
Neutrophils Relative %: 54.3 % (ref 43.0–77.0)
Platelets: 334 K/uL (ref 150.0–400.0)
RBC: 4.04 Mil/uL (ref 3.87–5.11)
RDW: 16.3 % — ABNORMAL HIGH (ref 11.5–15.5)
WBC: 6.6 K/uL (ref 4.0–10.5)

## 2024-03-22 NOTE — Progress Notes (Signed)
 "     Gloria Console, Gloria Nelson [address] [place], [place]  [address] Phone: [phone]   Gastroenterology Consultation  Referring Provider:     Shona Norleen PEDLAR, Gloria Nelson Primary Care Physician:  Shona Norleen PEDLAR, Gloria Nelson Primary Gastroenterologist:  Gloria Console, Gloria Nelson / Elspeth Naval, Gloria Nelson  Reason for Consultation:     Recurrent iron  deficiency anemia, bleeding hemorrhoids        HPI:   Discussed the use of AI scribe software for clinical note transcription with the patient, who gave verbal consent to proceed.  Patient is here with Spanish interpreter from: Language services.  She is also here today with her daughter.  Patient went to [facility] 02/10/2024 with a few day history of weakness.  History of anemia.  Hemoglobin was 7.4, MCV 101, platelet 488.  She is a [employer] and patient does not take blood products.  She was given IV iron  infusion.  Denied abdominal pain, melena, hematochezia.  Takes ferrous sulfate 325 mg once daily.  Folic acid 800 mg daily.  Patient last saw Dr. Naval in our office 09/2022 to evaluate rectal bleeding.  History of IDA, internal hemorrhoids, hypertension.  She has had intermittent episodes of rectal bleeding with severe anemia since 2006.  EGD and colonoscopy in 2006 showed internal hemorrhoids.  10/2022 last colonoscopy by Dr. Naval: 1 small (5 mm to 6 mm) polypoid lesion in the rectum.  Pathology showed neuroendocrine carcinoid tumor.  Removed with hot snare.  Clips placed.  Sigmoid diverticulosis.  Large inflamed internal hemorrhoids (source of bleeding/anemia).  12/2022 lower endoscopic ultrasound of Rectum by Dr. Wilhelmenia was largely unrevealing. - Repeat lower endoscopic ultrasound in 1- 2 years for surveillance based on final pathology.  Biopsy showed hyperplastic polyp with prolapse.  No dysplasia or malignancy.  11/ 2017 colonoscopy: No polyps.  Moderate internal hemorrhoids.  Underwent hemorrhoid banding.  History of  Present Illness Gloria Nelson is a 59 year old female with internal hemorrhoids, iron  deficiency anemia, and a resected rectal neuroendocrine tumor who presents for evaluation of ongoing rectal bleeding and anemia.  Rectal Bleeding: - Persistent rectal bleeding primarily associated with bowel movements - Bleeding appears as a stream of blood visible in the toilet, with occasional blood clots on toilet paper - Previously, audible blood flow during bowel movements last year; currently only visual observation of blood - No rectal bleeding outside of bowel movements - History of internal hemorrhoids with prior banding in 2017 - Suppositories used for symptom relief, purchased as needed  Colorectal Neoplasm Surveillance: - Small rectal neuroendocrine tumor identified and removed during colonoscopy in 2024 - Rectal ultrasound in October 2024 with recommendation for repeat imaging in 1-2 years - Colonoscopies performed in 2017 and 2024 for rectal bleeding  Iron  Deficiency Anemia: - Ongoing iron  deficiency anemia managed with intravenous and oral iron  supplementation - Hemoglobin measured at 7.4 in December 2025 - Continues iron  supplementation  Constipation Secondary to Iron  Therapy: - Mild constipation attributed to iron  supplementation - Maintains regular daily bowel movements - Uses Miralax and increased dietary fiber to maintain bowel regularity     Past Medical History:  Diagnosis Date   Anxiety    Carpal tunnel syndrome, bilateral    Colon polyps    Depression with anxiety    History of anemia    Hyperlipidemia    Hypertension    IDA (iron  deficiency anemia)    Left sided sciatica    Obesity  Paresthesias    Prediabetes     Past Surgical History:  Procedure Laterality Date   COLONOSCOPY  06/22/2004   Images only (marked for scanning), no report.   COLONOSCOPY N/A 01/30/2016   Procedure: COLONOSCOPY;  Surgeon: Lamar CHRISTELLA Hollingshead, Gloria Nelson;  Location: [facility];  Service:  Endoscopy;  Laterality: N/A;  830   ENDOMETRIAL ABLATION  2006   ovarian cyst removed   ENDOSCOPIC MUCOSAL RESECTION  01/01/2023   Procedure: ENDOSCOPIC MUCOSAL RESECTION;  Surgeon: Wilhelmenia Aloha Raddle., Gloria Nelson;  Location: [facility];  Service: Gastroenterology;;   EUS N/A 01/01/2023   Procedure: LOWER ENDOSCOPIC ULTRASOUND (EUS);  Surgeon: Wilhelmenia Aloha Raddle., Gloria Nelson;  Location: [facility];  Service: Gastroenterology;  Laterality: N/A;   FLEXIBLE SIGMOIDOSCOPY N/A 01/01/2023   Procedure: FLEXIBLE SIGMOIDOSCOPY;  Surgeon: Wilhelmenia Aloha Raddle., Gloria Nelson;  Location: [facility];  Service: Gastroenterology;  Laterality: N/A;   HEMOSTASIS CLIP PLACEMENT  01/01/2023   Procedure: HEMOSTASIS CLIP PLACEMENT;  Surgeon: Wilhelmenia Aloha Raddle., Gloria Nelson;  Location: [facility];  Service: Gastroenterology;;   SUBMUCOSAL LIFTING INJECTION  01/01/2023   Procedure: SUBMUCOSAL LIFTING INJECTION;  Surgeon: Wilhelmenia Aloha Raddle., Gloria Nelson;  Location: [facility];  Service: Gastroenterology;;    Prior to Admission medications  Medication Sig Start Date End Date Taking? Authorizing Provider  acetaminophen  (TYLENOL ) 500 MG tablet Take 500-1,000 mg by mouth every 8 (eight) hours as needed for mild pain.     Provider, Historical, Gloria Nelson  ferrous sulfate 325 (65 FE) MG EC tablet Take 325 mg by mouth daily with breakfast.    Provider, Historical, Gloria Nelson  fexofenadine (ALLEGRA ALLERGY) 180 MG tablet Take 180 mg by mouth daily as needed for allergies.    Provider, Historical, Gloria Nelson  folic acid (FOLVITE) 400 MCG tablet Take 800 mcg by mouth daily.    Provider, Historical, Gloria Nelson  metFORMIN (GLUCOPHAGE-XR) 500 MG 24 hr tablet Take 500 mg by mouth daily. 11/30/23   Provider, Historical, Gloria Nelson  methenamine  (HIPREX ) 1 g tablet Take 1 tablet (1 g total) by mouth 2 (two) times daily with a meal. 02/02/24   Matilda Senior, Gloria Nelson  Rectal Protectant-Emollient (CALMOL-4) 76-10 % SUPP Apply a pea-sized amount of 1% hydrocortisone  cream to the suppository  before insertion.  Use twice daily 10/02/22   Armbruster, Elspeth SQUIBB, Gloria Nelson  rosuvastatin  (CRESTOR ) 5 MG tablet Take 5 mg by mouth daily. 08/30/22   Provider, Historical, Gloria Nelson    Family History  Problem Relation Age of Onset   Hypertension Mother    Obesity Mother    Diabetes Father    Kidney disease Father    Blindness Father    Hypertension Sister    Hyperlipidemia Sister    Ovarian cancer Sister    Diabetes Brother    Colon polyps Maternal Uncle    Anxiety disorder Daughter    Anxiety disorder Son    Colon cancer Neg Hx    Esophageal cancer Neg Hx    Rectal cancer Neg Hx    Stomach cancer Neg Hx      Social History[1]  Allergies as of 03/22/2024 - Review Complete 03/22/2024  Allergen Reaction Noted   Other  01/28/2016    Review of Systems:    All systems reviewed and negative except where noted in HPI.   Physical Exam:  BP 130/80   Pulse 84   Ht 4' 11 (1.499 m)   Wt 164 lb (74.4 kg)   LMP 08/28/2015   BMI 33.12 kg/m  Patient's last menstrual period was 08/28/2015.  General:  Alert,  Well-developed, well-nourished, pleasant and cooperative in NAD Lungs:  Respirations even and unlabored.  Clear throughout to auscultation.   No wheezes, crackles, or rhonchi. No acute distress. Heart:  Regular rate and rhythm; no murmurs, clicks, rubs, or gallops. Abdomen:  Normal bowel sounds.  No bruits.  Soft, and non-distended without masses, hepatosplenomegaly or hernias noted.  No Tenderness.  No guarding or rebound tenderness.    Neurologic:  Alert and oriented x3;  grossly normal neurologically. Psych:  Alert and cooperative. Normal mood and affect. Rectal exam deferred until time of lower EUS.   Imaging Studies: No results found.  Labs: CBC    Component Value Date/Time   WBC 6.6 03/22/2024 1534   RBC 4.04 03/22/2024 1534   HGB 12.2 03/22/2024 1534   HCT 36.8 03/22/2024 1534   PLT 334.0 03/22/2024 1534   MCV 91.2 03/22/2024 1534    CMP     Component Value  Date/Time   NA 142 02/12/2024 1353   K 3.7 02/12/2024 1353   CL 106 02/12/2024 1353   CO2 24 02/12/2024 1353   GLUCOSE 167 (H) 02/12/2024 1353   BUN 11 02/12/2024 1353   CREATININE 0.66 02/12/2024 1353   CALCIUM  9.4 02/12/2024 1353   PROT 6.2 (L) 09/08/2022 1036   ALBUMIN 3.4 (L) 09/08/2022 1036   AST 29 09/08/2022 1036   ALT 42 09/08/2022 1036   ALKPHOS 75 09/08/2022 1036   BILITOT 0.3 09/08/2022 1036   GFRNONAA >60 02/12/2024 1353   GFRAA >60 06/13/2015 1729    Assessment and Plan:   Gloria Nelson is a 59 y.o. y/o female has been referred for chronic recurrent:   1.  Recurrent rectal bleeding due to the large internal hemorrhoids - Schedule repeat repeat lower EUS - Discussed scheduling internal hemorrhoid banding with Dr. Leigh after that procedure - Continue- Calmol4 suppositories BID - can get more OTC  2.  Chronic recurrent iron  deficiency anemia - Labs CBC, iron  panel, ferritin - Continue IV iron  infusions as needed  3.  Blood transfusion declined because patient is Jehov[employer]'s Witness - Is on oral and IV iron  as needed - she is refusing blood transfusion in all circumstances as outlined.  4.  History of small neuroendocrine tumor of the rectum - Scheduling repeat rectal endoscopic ultrasound for surveillance with Dr. Wilhelmenia at hospital.  5.  Chronic constipation - Stressed the importance of treating underlying constipation to right hemorrhoid bleeding. - Advised patient to take MiraLAX 1 capful every day.  Assessment & Plan Iron  deficiency anemia Chronic severe anemia with hemoglobin at 7.4 g/dL, requiring iron  supplementation. Hemoglobin improvement needed before further procedures. - Ordered blood work to assess hemoglobin and iron  status. - Deferred colonoscopy and rectal ultrasound until hemoglobin improves.  Internal hemorrhoids with rectal bleeding Persistent bleeding from large internal hemorrhoids, improved with suppositories. Further  evaluation and possible repeat banding after hemoglobin improves. - Continued suppositories for symptom control. - Plan to schedule colonoscopy with rectal ultrasound once hemoglobin improves. - Discussed possible repeat banding procedure after colonoscopy.  Benign neuroendocrine tumor of rectum, post-resection Post-resection with no contribution to current bleeding. Surveillance imaging recommended. - Plan to repeat rectal ultrasound   Constipation secondary to iron  therapy Mild constipation likely due to iron  supplementation, with daily bowel movements maintained. Important to manage to prevent hemorrhoidal bleeding. - Recommended continued use of Miralax as needed. - Advised high-fiber diet. - Discussed alternative constipation treatments if Miralax is insufficient.  Follow up with Dr. Leigh after lower  EUS.  Gloria Console, Gloria Nelson       [1]  Social History Tobacco Use   Smoking status: Never   Smokeless tobacco: Never  Vaping Use   Vaping status: Never Used  Substance Use Topics   Alcohol use: No   Drug use: No   "

## 2024-03-22 NOTE — Patient Instructions (Addendum)
 Your provider has requested that you go to the basement level for lab work before leaving today. Press B on the elevator. The lab is located at the first door on the left as you exit the elevator.  You have been scheduled for a Lower Endoscopic Ultrasound (EUS). Please follow the written instructions given to you at your visit today.  If you use inhalers (even only as needed), please bring them with you on the day of your procedure.  DO NOT TAKE 7 DAYS PRIOR TO TEST- Trulicity (dulaglutide) Ozempic, Wegovy (semaglutide) Mounjaro, Zepbound (tirzepatide) Bydureon Bcise (exanatide extended release)  DO NOT TAKE 1 DAY PRIOR TO YOUR TEST Rybelsus (semaglutide) Adlyxin (lixisenatide) Victoza (liraglutide) Byetta (exanatide) ___________________________________________________________________________

## 2024-03-23 LAB — IRON,TIBC AND FERRITIN PANEL
%SAT: 9 % — ABNORMAL LOW (ref 16–45)
Ferritin: 16 ng/mL (ref 16–232)
Iron: 36 ug/dL — ABNORMAL LOW (ref 45–160)
TIBC: 379 ug/dL (ref 250–450)

## 2024-03-23 NOTE — Progress Notes (Signed)
 Agree with assessment plan with the following thoughts:  Her bleeding is due to hemorrhoids, I am fairly confident of that.  She has responded to iron , her hemoglobin is back in the 12's and normal.  Interestingly she had an MCV over 100 and remains in the 90s.  She should have labs to exclude B12, folate deficiency and check TSH as well if we can have her go back to the lab to get that done.  She is currently scheduled for a repeat lower EUS with Dr. Wilhelmenia next month for surveillance of rectal carcinoid, she should keep that appointment and this can further evaluate the issue.  Agree with MiraLAX to keep stools soft and would recommend Anusol  or Calmol 4 suppositories for bleeding in the interim.  After that exam, assuming nothing else concerning, can consider hemorrhoid banding or surgical evaluation for the hemorrhoids given recurrent anemia related to this problem.  I would repeat hemoglobin in 1 month to make sure stable

## 2024-03-24 ENCOUNTER — Ambulatory Visit: Payer: Self-pay | Admitting: Physician Assistant

## 2024-03-24 ENCOUNTER — Telehealth: Payer: Self-pay | Admitting: *Deleted

## 2024-03-24 DIAGNOSIS — D509 Iron deficiency anemia, unspecified: Secondary | ICD-10-CM

## 2024-03-24 NOTE — Telephone Encounter (Signed)
-----   Message from Ellouise Console, [place] sent at 03/24/2024  4:45 PM EST ----- Regarding: More Labs Pod A triage nurse,  - Patient needs Spanish interpreter. -Please schedule lab visit to check B12, folate, and TSH. -Continue with plan for EUS procedure with Dr. Yisroel Roddy as scheduled. -Continue MiraLAX daily and Calmol 4 suppositories daily. -Repeat CBC in 1 month to recheck hemoglobin. Thank you, Ellouise Console, PA-C ----- Message ----- From: Leigh Elspeth SQUIBB, MD Sent: 03/23/2024   7:51 AM EST To: Ellouise Console, PA-C

## 2024-03-24 NOTE — Telephone Encounter (Signed)
 Orders placed for labs to be completed.  Reminder placed in EPIC for CBC to be completed around 04/24/24  Note sent to patient with results and recommendations via Mychart.

## 2024-04-06 ENCOUNTER — Telehealth: Payer: Self-pay

## 2024-04-06 NOTE — Telephone Encounter (Signed)
 Due to schedule change appt moved to 05/12/24 at 230 pm at [facility] with GM

## 2024-04-12 NOTE — Telephone Encounter (Signed)
Left message on machine to call back using interpreter

## 2024-04-13 NOTE — Telephone Encounter (Signed)
 Left message on machine to call back using interpreter   I have mailed the change to the pt and sent to My Chart.  She does view  messages

## 2024-04-15 ENCOUNTER — Other Ambulatory Visit

## 2024-04-15 ENCOUNTER — Ambulatory Visit: Payer: Self-pay | Admitting: Physician Assistant

## 2024-04-15 DIAGNOSIS — D509 Iron deficiency anemia, unspecified: Secondary | ICD-10-CM

## 2024-04-15 LAB — VITAMIN B12: Vitamin B-12: 933 pg/mL — ABNORMAL HIGH (ref 211–911)

## 2024-04-15 LAB — FOLATE: Folate: >23.4 ng/mL

## 2024-04-15 LAB — TSH: TSH: 1.41 u[IU]/mL (ref 0.35–5.50)

## 2024-04-15 NOTE — Telephone Encounter (Signed)
 Inbound call from patient daughter stating that she was returning a call back to [place] for her mother. Patient daughter is requesting a call back. Please advise.

## 2024-04-15 NOTE — Telephone Encounter (Signed)
 The pt and her daughter have been advised of the new date and time

## 2024-05-03 ENCOUNTER — Ambulatory Visit: Admitting: Urology

## 2024-05-12 ENCOUNTER — Encounter (HOSPITAL_COMMUNITY): Payer: Self-pay

## 2024-05-12 ENCOUNTER — Ambulatory Visit (HOSPITAL_COMMUNITY): Admit: 2024-05-12 | Admitting: Gastroenterology
